# Patient Record
Sex: Female | Born: 1960
Health system: Southern US, Community
[De-identification: ages and names within clinical notes are randomized; demographics above are authoritative.]

## PROBLEM LIST (undated history)

## (undated) DIAGNOSIS — A6000 Herpesviral infection of urogenital system, unspecified: Secondary | ICD-10-CM

## (undated) DIAGNOSIS — M51369 Other intervertebral disc degeneration, lumbar region without mention of lumbar back pain or lower extremity pain: Secondary | ICD-10-CM

## (undated) DIAGNOSIS — M5136 Other intervertebral disc degeneration, lumbar region: Secondary | ICD-10-CM

## (undated) DIAGNOSIS — K219 Gastro-esophageal reflux disease without esophagitis: Secondary | ICD-10-CM

## (undated) DIAGNOSIS — G4700 Insomnia, unspecified: Secondary | ICD-10-CM

## (undated) DIAGNOSIS — G8929 Other chronic pain: Secondary | ICD-10-CM

## (undated) DIAGNOSIS — I10 Essential (primary) hypertension: Secondary | ICD-10-CM

## (undated) DIAGNOSIS — E119 Type 2 diabetes mellitus without complications: Secondary | ICD-10-CM

## (undated) DIAGNOSIS — M549 Dorsalgia, unspecified: Secondary | ICD-10-CM

## (undated) HISTORY — PX: COLONOSCOPY: SHX174

## (undated) HISTORY — DX: Dorsalgia, unspecified: M54.9

## (undated) HISTORY — DX: Other chronic pain: G89.29

---

## 2001-11-11 DIAGNOSIS — I10 Essential (primary) hypertension: Secondary | ICD-10-CM | POA: Insufficient documentation

## 2004-08-26 ENCOUNTER — Ambulatory Visit: Payer: Self-pay | Admitting: Family Medicine

## 2004-10-15 ENCOUNTER — Ambulatory Visit: Payer: Self-pay | Admitting: Gastroenterology

## 2005-03-04 ENCOUNTER — Ambulatory Visit: Payer: Self-pay | Admitting: General Practice

## 2006-04-06 ENCOUNTER — Ambulatory Visit: Payer: Self-pay | Admitting: General Practice

## 2006-09-06 DIAGNOSIS — Z833 Family history of diabetes mellitus: Secondary | ICD-10-CM | POA: Insufficient documentation

## 2006-09-06 DIAGNOSIS — Z800 Family history of malignant neoplasm of digestive organs: Secondary | ICD-10-CM | POA: Insufficient documentation

## 2006-09-24 DIAGNOSIS — A6000 Herpesviral infection of urogenital system, unspecified: Secondary | ICD-10-CM | POA: Insufficient documentation

## 2006-09-24 DIAGNOSIS — K219 Gastro-esophageal reflux disease without esophagitis: Secondary | ICD-10-CM | POA: Insufficient documentation

## 2006-09-24 DIAGNOSIS — D219 Benign neoplasm of connective and other soft tissue, unspecified: Secondary | ICD-10-CM | POA: Insufficient documentation

## 2007-01-20 ENCOUNTER — Ambulatory Visit: Payer: Self-pay | Admitting: Gastroenterology

## 2007-04-14 ENCOUNTER — Ambulatory Visit: Payer: Self-pay | Admitting: Endocrinology

## 2007-12-27 DIAGNOSIS — M545 Low back pain, unspecified: Secondary | ICD-10-CM | POA: Insufficient documentation

## 2009-12-18 DIAGNOSIS — R7309 Other abnormal glucose: Secondary | ICD-10-CM | POA: Insufficient documentation

## 2011-10-14 ENCOUNTER — Ambulatory Visit: Payer: Self-pay

## 2013-02-15 ENCOUNTER — Ambulatory Visit: Payer: Self-pay

## 2013-06-21 ENCOUNTER — Ambulatory Visit: Payer: Self-pay | Admitting: Family Medicine

## 2014-02-20 ENCOUNTER — Ambulatory Visit: Payer: Self-pay | Admitting: Family Medicine

## 2015-01-23 ENCOUNTER — Ambulatory Visit
Admit: 2015-01-23 | Disposition: A | Discharge status: Home / Self Care | Payer: Self-pay | Attending: Family Medicine | Admitting: Family Medicine

## 2015-03-25 ENCOUNTER — Other Ambulatory Visit: Payer: Self-pay | Admitting: Family Medicine

## 2015-03-25 DIAGNOSIS — Z1231 Encounter for screening mammogram for malignant neoplasm of breast: Secondary | ICD-10-CM

## 2015-03-27 ENCOUNTER — Ambulatory Visit
Admission: RE | Admit: 2015-03-27 | Discharge: 2015-03-27 | Disposition: A | Discharge status: Home / Self Care | Payer: BLUE CROSS/BLUE SHIELD | Source: Ambulatory Visit | Attending: Family Medicine | Admitting: Family Medicine

## 2015-03-27 DIAGNOSIS — Z1231 Encounter for screening mammogram for malignant neoplasm of breast: Secondary | ICD-10-CM

## 2015-06-12 ENCOUNTER — Other Ambulatory Visit: Payer: Self-pay | Admitting: Family Medicine

## 2015-06-12 DIAGNOSIS — M545 Low back pain: Secondary | ICD-10-CM

## 2015-06-13 DIAGNOSIS — M79604 Pain in right leg: Secondary | ICD-10-CM | POA: Insufficient documentation

## 2015-06-13 NOTE — Telephone Encounter (Signed)
Last OV 01/2015  Thanks,   -Laura  

## 2015-09-25 ENCOUNTER — Other Ambulatory Visit: Payer: Self-pay | Admitting: Family Medicine

## 2015-09-26 NOTE — Telephone Encounter (Signed)
Refilled Mobic and remind patient to schedule follow up appointment.

## 2015-09-26 NOTE — Telephone Encounter (Signed)
Attempted to contact patient. No answer nor voicemail.  

## 2015-09-27 NOTE — Telephone Encounter (Signed)
Attempted to contact patient. No answer nor voicemail.  

## 2015-10-08 ENCOUNTER — Telehealth: Payer: Self-pay

## 2015-10-08 NOTE — Telephone Encounter (Signed)
pt advised that her mobic was filled but she is due for appt. She states she will call back in the new year.

## 2016-01-28 ENCOUNTER — Ambulatory Visit (INDEPENDENT_AMBULATORY_CARE_PROVIDER_SITE_OTHER): Payer: BLUE CROSS/BLUE SHIELD | Admitting: Family Medicine

## 2016-01-28 ENCOUNTER — Encounter: Payer: Self-pay | Admitting: Family Medicine

## 2016-01-28 VITALS — BP 120/82 | HR 79 | Temp 98.3°F | Resp 16 | Wt 215.2 lb

## 2016-01-28 DIAGNOSIS — M545 Low back pain, unspecified: Secondary | ICD-10-CM | POA: Insufficient documentation

## 2016-01-28 DIAGNOSIS — M5136 Other intervertebral disc degeneration, lumbar region: Secondary | ICD-10-CM | POA: Diagnosis not present

## 2016-01-28 MED ORDER — PREDNISONE 10 MG PO TABS: ORAL_TABLET | ORAL | Status: DC

## 2016-01-28 NOTE — Patient Instructions (Signed)
Consider physical therapy and/or TENS unit. Wear your corset if possible to give your back more support.

## 2016-01-28 NOTE — Progress Notes (Signed)
Subjective:     Patient ID: Janet Walters, female   DOB: [birth year], 55 y.o.   MRN: [id number]  HPI  Chief Complaint  Patient presents with  . Leg Pain    Patient comes in office today with concerns of bilateral leg pain for the past 2-3 weeks. Patient describes pain as achy and she only experiences pain when laying down at night  . Back Pain    Patient would like to address chronic lower back pain, she states that she has been taking otc Aleve for pain and was wearing back brace for support but pain has not decreased.   Has had prior evaluation per orthopedics in 2015 with lumbar x-ray demonstrating significant DDD. Provided with back corset and recommended physical therapy for 4 weeks. Patient states she did not follow through with the therapy. Has seen a chiropractor on one occasion recently. Has taken Aleve intermittently. States she remains active in her job at [employer] and at home doing yard work.   Review of Systems     Objective:   Physical Exam  Constitutional: She appears well-developed and well-nourished. No distress.  Musculoskeletal:  Muscle strength in lower extremities 5/5. SLR's to 90 degrees without radiation of back pain. Mild back discomfort going from sitting to lying.       Assessment:    1. DDD (degenerative disc disease), lumbar - predniSONE (DELTASONE) 10 MG tablet; Taper daily as follows: 6 pills, 5, 4, 3, 2, 1  Dispense: 21 tablet; Refill: 0  2. Midline low back pain, with sciatica presence unspecified - predniSONE (DELTASONE) 10 MG tablet; Taper daily as follows: 6 pills, 5, 4, 3, 2, 1  Dispense: 21 tablet; Refill: 0    Plan:    Have asked her to consider physical therapy, TENS unit, and to wear her corset on a regular basis.

## 2016-02-03 ENCOUNTER — Telehealth: Payer: Self-pay | Admitting: Family Medicine

## 2016-02-03 NOTE — Telephone Encounter (Signed)
Pt was in last Tuesday with leg and back pain,.  She was give predisone.  She finished the medication but is still having rt leg pain.  Please advise.  [phone] or [phone]  Thanks, Con Memos

## 2016-02-04 ENCOUNTER — Other Ambulatory Visit: Payer: Self-pay | Admitting: Family Medicine

## 2016-02-04 NOTE — Telephone Encounter (Signed)
I would like to send her to physical therapy if she agrees. May take two Aleve twice daily with food and wear her corset in the meantime.

## 2016-02-04 NOTE — Telephone Encounter (Signed)
Spoke with patient on the phone and advised as below, she wants to hold off on PT and will try Aleve BID

## 2016-02-04 NOTE — Telephone Encounter (Signed)
Please review. KW 

## 2016-02-04 NOTE — Telephone Encounter (Signed)
[id number] 

## 2016-02-11 ENCOUNTER — Other Ambulatory Visit: Payer: Self-pay | Admitting: Family Medicine

## 2016-02-19 ENCOUNTER — Telehealth: Payer: Self-pay | Admitting: Family Medicine

## 2016-02-21 ENCOUNTER — Ambulatory Visit (INDEPENDENT_AMBULATORY_CARE_PROVIDER_SITE_OTHER): Payer: BLUE CROSS/BLUE SHIELD | Admitting: Family Medicine

## 2016-02-21 ENCOUNTER — Encounter: Payer: Self-pay | Admitting: Family Medicine

## 2016-02-21 VITALS — BP 102/66 | HR 64 | Temp 98.2°F | Resp 16 | Ht 66.0 in | Wt 216.0 lb

## 2016-02-21 DIAGNOSIS — I10 Essential (primary) hypertension: Secondary | ICD-10-CM

## 2016-02-21 DIAGNOSIS — R7309 Other abnormal glucose: Secondary | ICD-10-CM

## 2016-02-21 DIAGNOSIS — Z1231 Encounter for screening mammogram for malignant neoplasm of breast: Secondary | ICD-10-CM

## 2016-02-21 DIAGNOSIS — G4700 Insomnia, unspecified: Secondary | ICD-10-CM

## 2016-02-21 MED ORDER — TRAZODONE HCL 50 MG PO TABS: 25.0000 mg | ORAL_TABLET | Freq: Every evening | ORAL | Status: DC | PRN

## 2016-02-21 NOTE — Progress Notes (Signed)
Subjective:    Patient ID: Janet Walters, female    DOB: [birth year], 55 y.o.   MRN: [id number]  Rash This is a new problem. The current episode started 1 to 4 weeks ago (x 3 days). The problem has been gradually improving since onset. Location: left side of chest. The rash is characterized by itchiness. She was exposed to nothing. Associated symptoms include congestion (sinuses). Pertinent negatives include no anorexia, cough, eye pain, facial edema, fatigue, fever, rhinorrhea, shortness of breath, sore throat or vomiting. Past treatments include nothing.   Also has headaches related to stress.  Learning a new job at work.  Happens for 5 days straight at times but not weekly.   Hot towels helps some.   Happens in spells. Not daily.  Does happen more when stressed.  Does not want a daily medication. Does not want a medication that will make her sleepy or addictive.     Also was having trouble sleeping.   Has no medication for this. Would like a medication to take as needed.  Does not feels she needs something every night.    Review of Systems  Constitutional: Negative for fever and fatigue.  HENT: Positive for congestion (sinuses). Negative for rhinorrhea and sore throat.   Eyes: Negative for pain.  Respiratory: Negative for cough and shortness of breath.   Gastrointestinal: Negative for vomiting and anorexia.  Skin: Positive for rash.  Neurological: Positive for headaches.  Psychiatric/Behavioral: Positive for sleep disturbance.   BP 102/66 mmHg  Pulse 64  Temp(Src) 98.2 F (36.8 C) (Oral)  Resp 16  Ht 5\' 6"  (1.676 m)  Wt 216 lb (97.977 kg)  BMI 34.88 kg/m2     Patient Active Problem List   Diagnosis Date Noted  . DDD (degenerative disc disease), lumbar 01/28/2016  . Leg pain, right 06/13/2015  . Abnormal blood sugar 12/18/2009  . LBP (low back pain) 12/27/2007  . Fibroid 09/24/2006  . Genital herpes 09/24/2006  . Acid reflux 09/24/2006  . Family history of cancer of digestive  system 09/06/2006  . Family history of diabetes mellitus 09/06/2006  . Essential (primary) hypertension 11/11/2001   Past Medical History  Diagnosis Date  . Back pain, chronic    Current Outpatient Prescriptions on File Prior to Visit  Medication Sig  . lisinopril-hydrochlorothiazide (PRINZIDE,ZESTORETIC) 20-25 MG tablet TAKE 1 TABLET BY MOUTH DAILY   No current facility-administered medications on file prior to visit.   No Known Allergies No past surgical history on file. Social History   Social History  . Marital Status: Single    Spouse Name: N/A  . Number of Children: N/A  . Years of Education: N/A   Occupational History  . Not on file.   Social History Main Topics  . Smoking status: Never Smoker   . Smokeless tobacco: Not on file  . Alcohol Use: No  . Drug Use: Not on file  . Sexual Activity: Not on file   Other Topics Concern  . Not on file   Social History Narrative   Family History  Problem Relation Age of Onset  . Breast cancer Paternal Aunt   . Breast cancer Paternal Aunt   . Breast cancer Paternal Aunt     Objective:   Physical Exam  Constitutional: She appears well-developed and well-nourished.  HENT:  Head: Normocephalic and atraumatic.  Right Ear: Tympanic membrane normal.  Left Ear: Tympanic membrane normal.  Mouth/Throat: Oropharynx is clear and moist.  Turbinates are swollen  Cardiovascular:  Normal rate and regular rhythm.   Pulmonary/Chest: Effort normal and breath sounds normal. No respiratory distress.  Psychiatric: She has a normal mood and affect. Her behavior is normal.  BP 102/66 mmHg  Pulse 64  Temp(Src) 98.2 F (36.8 C) (Oral)  Resp 16  Ht 5\' 6"  (1.676 m)  Wt 216 lb (97.977 kg)  BMI 34.88 kg/m2     Assessment & Plan:   1. Encounter for screening mammogram for breast cancer Order place today.   - MM DIGITAL SCREENING BILATERAL; Future  2. Essential (primary) hypertension Condition is stable. Please continue current  medication and  plan of care as noted.  Will check labs.  Follow up with Tawanna Sat for CPE and Simona Huh for her other medical problems.   - Lipid panel - Comprehensive metabolic panel - CBC with Differential/Platelet - TSH  3. Abnormal blood sugar Will check labs.  - Hemoglobin A1c  4. Insomnia New problem. Worsening.   Will treat with medication. Patient instructed to call back if condition worsens or does not improve.    - traZODone (DESYREL) 50 MG tablet; Take 0.5-1 tablets (25-50 mg total) by mouth at bedtime as needed for sleep.  Dispense: 30 tablet; Refill: 3   Patient seen and examined by Jerrell Belfast, MD, and note scribed by Renaldo Fiddler, CMA.  I have reviewed the document for accuracy and completeness and I agree with above. Jerrell Belfast, MD   Margarita Rana, MD

## 2016-03-23 ENCOUNTER — Other Ambulatory Visit: Payer: Self-pay | Admitting: Family Medicine

## 2016-03-23 NOTE — Telephone Encounter (Signed)
See refill request.

## 2016-05-01 DIAGNOSIS — I10 Essential (primary) hypertension: Secondary | ICD-10-CM | POA: Diagnosis not present

## 2016-05-01 DIAGNOSIS — R7309 Other abnormal glucose: Secondary | ICD-10-CM | POA: Diagnosis not present

## 2016-05-02 LAB — CBC WITH DIFFERENTIAL/PLATELET
BASOS: 1 %
Basophils Absolute: 0 10*3/uL (ref 0.0–0.2)
EOS (ABSOLUTE): 0.1 10*3/uL (ref 0.0–0.4)
Eos: 3 %
HEMATOCRIT: 38.4 % (ref 34.0–46.6)
Hemoglobin: 13 g/dL (ref 11.1–15.9)
Immature Grans (Abs): 0 10*3/uL (ref 0.0–0.1)
Immature Granulocytes: 0 %
Lymphocytes Absolute: 1.9 10*3/uL (ref 0.7–3.1)
Lymphs: 33 %
MCH: 26.3 pg — AB (ref 26.6–33.0)
MCHC: 33.9 g/dL (ref 31.5–35.7)
MCV: 78 fL — AB (ref 79–97)
MONOS ABS: 0.4 10*3/uL (ref 0.1–0.9)
Monocytes: 7 %
NEUTROS ABS: 3.2 10*3/uL (ref 1.4–7.0)
Neutrophils: 56 %
PLATELETS: 273 10*3/uL (ref 150–379)
RBC: 4.95 x10E6/uL (ref 3.77–5.28)
RDW: 12.9 % (ref 12.3–15.4)
WBC: 5.6 10*3/uL (ref 3.4–10.8)

## 2016-05-02 LAB — COMPREHENSIVE METABOLIC PANEL
A/G RATIO: 1.8 (ref 1.2–2.2)
ALBUMIN: 4.4 g/dL (ref 3.5–5.5)
ALT: 9 IU/L (ref 0–32)
AST: 16 IU/L (ref 0–40)
Alkaline Phosphatase: 89 IU/L (ref 39–117)
BUN / CREAT RATIO: 17 (ref 9–23)
BUN: 13 mg/dL (ref 6–24)
Bilirubin Total: 0.8 mg/dL (ref 0.0–1.2)
CALCIUM: 9.4 mg/dL (ref 8.7–10.2)
CO2: 22 mmol/L (ref 18–29)
Chloride: 102 mmol/L (ref 96–106)
Creatinine, Ser: 0.76 mg/dL (ref 0.57–1.00)
GFR, EST AFRICAN AMERICAN: 101 mL/min/{1.73_m2} (ref >59)
GFR, EST NON AFRICAN AMERICAN: 88 mL/min/{1.73_m2} (ref >59)
GLOBULIN, TOTAL: 2.5 g/dL (ref 1.5–4.5)
Glucose: 79 mg/dL (ref 65–99)
POTASSIUM: 3.6 mmol/L (ref 3.5–5.2)
SODIUM: 141 mmol/L (ref 134–144)
TOTAL PROTEIN: 6.9 g/dL (ref 6.0–8.5)

## 2016-05-02 LAB — LIPID PANEL
CHOLESTEROL TOTAL: 180 mg/dL (ref 100–199)
Chol/HDL Ratio: 3 ratio units (ref 0.0–4.4)
HDL: 60 mg/dL (ref >39)
LDL CALC: 107 mg/dL — AB (ref 0–99)
TRIGLYCERIDES: 63 mg/dL (ref 0–149)
VLDL Cholesterol Cal: 13 mg/dL (ref 5–40)

## 2016-05-02 LAB — HEMOGLOBIN A1C
Est. average glucose Bld gHb Est-mCnc: 111 mg/dL
Hgb A1c MFr Bld: 5.5 % (ref 4.8–5.6)

## 2016-05-02 LAB — TSH: TSH: 1.95 u[IU]/mL (ref 0.450–4.500)

## 2016-05-05 ENCOUNTER — Other Ambulatory Visit: Payer: Self-pay | Admitting: Family Medicine

## 2016-05-05 ENCOUNTER — Ambulatory Visit
Admission: RE | Admit: 2016-05-05 | Discharge: 2016-05-05 | Disposition: A | Discharge status: Home / Self Care | Payer: BLUE CROSS/BLUE SHIELD | Source: Ambulatory Visit | Attending: Family Medicine | Admitting: Family Medicine

## 2016-05-05 DIAGNOSIS — Z1231 Encounter for screening mammogram for malignant neoplasm of breast: Secondary | ICD-10-CM

## 2016-05-29 ENCOUNTER — Encounter: Payer: Self-pay | Admitting: Physician Assistant

## 2016-05-29 ENCOUNTER — Ambulatory Visit (INDEPENDENT_AMBULATORY_CARE_PROVIDER_SITE_OTHER): Payer: BLUE CROSS/BLUE SHIELD | Admitting: Physician Assistant

## 2016-05-29 VITALS — BP 124/90 | HR 62 | Temp 97.9°F | Resp 16 | Ht 68.0 in | Wt 212.8 lb

## 2016-05-29 DIAGNOSIS — Z1211 Encounter for screening for malignant neoplasm of colon: Secondary | ICD-10-CM

## 2016-05-29 DIAGNOSIS — Z1159 Encounter for screening for other viral diseases: Secondary | ICD-10-CM | POA: Diagnosis not present

## 2016-05-29 DIAGNOSIS — Z0000 Encounter for general adult medical examination without abnormal findings: Secondary | ICD-10-CM

## 2016-05-29 DIAGNOSIS — Z800 Family history of malignant neoplasm of digestive organs: Secondary | ICD-10-CM

## 2016-05-29 DIAGNOSIS — Z124 Encounter for screening for malignant neoplasm of cervix: Secondary | ICD-10-CM | POA: Diagnosis not present

## 2016-05-29 NOTE — Progress Notes (Signed)
Patient: Janet Walters, Female    DOB: [birth year], 55 y.o.   MRN: [id number] Visit Date: 05/29/2016  Today's Provider: Mar Daring, PA-C   Chief Complaint  Patient presents with  . Annual Exam   Subjective:    Annual physical exam Janet Walters is a 55 y.o. female who presents today for health maintenance and complete physical. She feels fairly well. She reports exercising daily at work(walking). She reports she is sleeping average 6 hours per night.  Mammogram: 05/05/16 BI-RADS Cat. 1-Negative Colonoscopy:01/20/2007 -----------------------------------------------------------------   Review of Systems  Constitutional: Negative.   HENT: Positive for dental problem.   Eyes: Negative.   Respiratory: Negative.   Cardiovascular: Negative.   Gastrointestinal: Negative.   Endocrine: Negative.   Genitourinary: Negative.   Musculoskeletal: Positive for arthralgias, back pain and myalgias.  Skin: Negative.   Allergic/Immunologic: Negative.   Neurological: Positive for headaches.  Hematological: Negative.   Psychiatric/Behavioral: Negative.   Headache only today from stress, arthralgias chronic and dental problem she had a root canal break and is going to dentist.  Social History      She  reports that she has never smoked. She has never used smokeless tobacco. She reports that she does not drink alcohol.       Social History   Social History  . Marital status: Single    Spouse name: N/A  . Number of children: N/A  . Years of education: N/A   Social History Main Topics  . Smoking status: Never Smoker  . Smokeless tobacco: Never Used  . Alcohol use No  . Drug use: Unknown  . Sexual activity: Not Asked   Other Topics Concern  . None   Social History Narrative  . None    Past Medical History:  Diagnosis Date  . Back pain, chronic      Patient Active Problem List   Diagnosis Date Noted  . Insomnia 02/21/2016  . DDD (degenerative disc disease),  lumbar 01/28/2016  . Leg pain, right 06/13/2015  . Abnormal blood sugar 12/18/2009  . LBP (low back pain) 12/27/2007  . Fibroid 09/24/2006  . Genital herpes 09/24/2006  . Acid reflux 09/24/2006  . Family history of cancer of digestive system 09/06/2006  . Family history of diabetes mellitus 09/06/2006  . Essential (primary) hypertension 11/11/2001    No past surgical history on file.  Family History        Family Status  Relation Status  . Paternal Aunt   . Paternal Aunt   . Paternal Aunt         Her family history includes Breast cancer in her paternal aunt, paternal aunt, and paternal aunt.    No Known Allergies  Current Meds  Medication Sig  . ibuprofen (ADVIL,MOTRIN) 800 MG tablet TAKE 1 TABLET BY MOUTH 3 TIMES A [place] WITH FOOD AS NEEDED FOR BACK PAIN  . lisinopril-hydrochlorothiazide (PRINZIDE,ZESTORETIC) 20-25 MG tablet TAKE 1 TABLET BY MOUTH DAILY  . traZODone (DESYREL) 50 MG tablet Take 0.5-1 tablets (25-50 mg total) by mouth at bedtime as needed for sleep.    Patient Care Team: Margo Common, PA as PCP - General (Family Medicine)     Objective:   Vitals: BP 124/90 (BP Location: Right Arm, Patient Position: Sitting, Cuff Size: Large)   Pulse 62   Temp 97.9 F (36.6 C) (Oral)   Resp 16   Ht 5\' 8"  (1.727 m)   Wt 212 lb 12.8 oz (96.5 kg)  BMI 32.36 kg/m    Physical Exam  Constitutional: She is oriented to person, place, and time. She appears well-developed and well-nourished. No distress.  HENT:  Head: Normocephalic and atraumatic.  Right Ear: Hearing, tympanic membrane, external ear and ear canal normal.  Left Ear: Hearing, tympanic membrane, external ear and ear canal normal.  Nose: Nose normal.  Mouth/Throat: Uvula is midline, oropharynx is clear and moist and mucous membranes are normal. No oropharyngeal exudate.  Eyes: Conjunctivae and EOM are normal. Pupils are equal, round, and reactive to light. Right eye exhibits no discharge. Left eye  exhibits no discharge. No scleral icterus.  Neck: Normal range of motion. Neck supple. No JVD present. Carotid bruit is not present. No tracheal deviation present. No thyromegaly present.  Cardiovascular: Normal rate, regular rhythm, normal heart sounds and intact distal pulses.  Exam reveals no gallop and no friction rub.   No murmur heard. Pulmonary/Chest: Effort normal and breath sounds normal. No respiratory distress. She has no wheezes. She has no rales. She exhibits no tenderness. Right breast exhibits no inverted nipple, no mass, no nipple discharge, no skin change and no tenderness. Left breast exhibits no inverted nipple, no mass, no nipple discharge, no skin change and no tenderness. Breasts are symmetrical.  Abdominal: Soft. Bowel sounds are normal. She exhibits no distension and no mass. There is no tenderness. There is no rebound and no guarding. Hernia confirmed negative in the right inguinal area and confirmed negative in the left inguinal area.  Genitourinary: Rectum normal, vagina normal and uterus normal. No breast swelling, tenderness, discharge or bleeding. Pelvic exam was performed with patient supine. There is no rash, tenderness, lesion or injury on the right labia. There is no rash, tenderness, lesion or injury on the left labia. Cervix exhibits no motion tenderness, no discharge and no friability. Right adnexum displays no mass, no tenderness and no fullness. Left adnexum displays no mass, no tenderness and no fullness. No erythema, tenderness or bleeding in the vagina. No signs of injury around the vagina. No vaginal discharge found.  Musculoskeletal: Normal range of motion. She exhibits no edema or tenderness.  Lymphadenopathy:    She has no cervical adenopathy.       Right: No inguinal adenopathy present.       Left: No inguinal adenopathy present.  Neurological: She is alert and oriented to person, place, and time. She has normal reflexes. No cranial nerve deficit.  Coordination normal.  Skin: Skin is warm and dry. No rash noted. She is not diaphoretic.  Psychiatric: She has a normal mood and affect. Her behavior is normal. Judgment and thought content normal.  Vitals reviewed.    Depression Screen No flowsheet data found.    Assessment & Plan:     Routine Health Maintenance and Physical Exam  Exercise Activities and Dietary recommendations Goals    None       There is no immunization history on file for this patient.  Health Maintenance  Topic Date Due  . Hepatitis C Screening  [birth year]  . HIV Screening  07/28/1974  . TETANUS/TDAP  07/28/1978  . PAP SMEAR  07/28/1980  . COLONOSCOPY  07/28/2009  . INFLUENZA VACCINE  05/12/2016  . MAMMOGRAM  05/05/2018      Discussed health benefits of physical activity, and encouraged her to engage in regular exercise appropriate for her age and condition.    1. Annual physical exam Normal physical exam today. Labs were normal from 05/01/16.  She is to call  the office in the meantime if she has any acute issue, questions or concerns.  2. Family history of cancer of digestive system Referral placed as below. Father had some form of cancer, patient doesn't remember, states he had a colostomy prior to his death in 23.  H/O polyps. - Ambulatory referral to Gastroenterology  4. Cervical cancer screening Pap collected today. Will send as below and f/u pending results. - Pap IG and HPV (high risk) DNA detection ([facility])  5. Colon cancer screening - Ambulatory referral to Gastroenterology  6. Need for hepatitis C screening test - Hepatitis C Antibody  --------------------------------------------------------------------    Mar Daring, PA-C  [facility]

## 2016-05-29 NOTE — Patient Instructions (Signed)
Health Maintenance, Female Adopting a healthy lifestyle and getting preventive care can go a long way to promote health and wellness. Talk with your health care provider about what schedule of regular examinations is right for you. This is a good chance for you to check in with your provider about disease prevention and staying healthy. In between checkups, there are plenty of things you can do on your own. Experts have done a lot of research about which lifestyle changes and preventive measures are most likely to keep you healthy. Ask your health care provider for more information. WEIGHT AND DIET  Eat a healthy diet  Be sure to include plenty of vegetables, fruits, low-fat dairy products, and lean protein.  Do not eat a lot of foods high in solid fats, added sugars, or salt.  Get regular exercise. This is one of the most important things you can do for your health.  Most adults should exercise for at least 150 minutes each week. The exercise should increase your heart rate and make you sweat (moderate-intensity exercise).  Most adults should also do strengthening exercises at least twice a week. This is in addition to the moderate-intensity exercise.  Maintain a healthy weight  Body mass index (BMI) is a measurement that can be used to identify possible weight problems. It estimates body fat based on height and weight. Your health care provider can help determine your BMI and help you achieve or maintain a healthy weight.  For females 28 years of age and older:   A BMI below 18.5 is considered underweight.  A BMI of 18.5 to 24.9 is normal.  A BMI of 25 to 29.9 is considered overweight.  A BMI of 30 and above is considered obese.  Watch levels of cholesterol and blood lipids  You should start having your blood tested for lipids and cholesterol at 57 years of age, then have this test every 5 years.  You may need to have your cholesterol levels checked more often if:  Your lipid  or cholesterol levels are high.  You are older than 57 years of age.  You are at high risk for heart disease.  CANCER SCREENING   Lung Cancer  Lung cancer screening is recommended for adults 57-66 years old who are at high risk for lung cancer because of a history of smoking.  A yearly low-dose CT scan of the lungs is recommended for people who:  Currently smoke.  Have quit within the past 15 years.  Have at least a 30-pack-year history of smoking. A pack year is smoking an average of one pack of cigarettes a [place] for 1 year.  Yearly screening should continue until it has been 15 years since you quit.  Yearly screening should stop if you develop a health problem that would prevent you from having lung cancer treatment.  Breast Cancer  Practice breast self-awareness. This means understanding how your breasts normally appear and feel.  It also means doing regular breast self-exams. Let your health care provider know about any changes, no matter how small.  If you are in your 57s or 30s, you should have a clinical breast exam (CBE) by a health care provider every 1-3 years as part of a regular health exam.  If you are 25 or older, have a CBE every year. Also consider having a breast X-ray (mammogram) every year.  If you have a family history of breast cancer, talk to your health care provider about genetic screening.  If you  are at high risk for breast cancer, talk to your health care provider about having an MRI and a mammogram every year.  Breast cancer gene (BRCA) assessment is recommended for women who have family members with BRCA-related cancers. BRCA-related cancers include:  Breast.  Ovarian.  Tubal.  Peritoneal cancers.  Results of the assessment will determine the need for genetic counseling and BRCA1 and BRCA2 testing. Cervical Cancer Your health care provider may recommend that you be screened regularly for cancer of the pelvic organs (ovaries, uterus, and  vagina). This screening involves a pelvic examination, including checking for microscopic changes to the surface of your cervix (Pap test). You may be encouraged to have this screening done every 3 years, beginning at age 21.  For women ages 30-65, health care providers may recommend pelvic exams and Pap testing every 3 years, or they may recommend the Pap and pelvic exam, combined with testing for human papilloma virus (HPV), every 5 years. Some types of HPV increase your risk of cervical cancer. Testing for HPV may also be done on women of any age with unclear Pap test results.  Other health care providers may not recommend any screening for nonpregnant women who are considered low risk for pelvic cancer and who do not have symptoms. Ask your health care provider if a screening pelvic exam is right for you.  If you have had past treatment for cervical cancer or a condition that could lead to cancer, you need Pap tests and screening for cancer for at least 20 years after your treatment. If Pap tests have been discontinued, your risk factors (such as having a new sexual partner) need to be reassessed to determine if screening should resume. Some women have medical problems that increase the chance of getting cervical cancer. In these cases, your health care provider may recommend more frequent screening and Pap tests. Colorectal Cancer  This type of cancer can be detected and often prevented.  Routine colorectal cancer screening usually begins at 57 years of age and continues through 57 years of age.  Your health care provider may recommend screening at an earlier age if you have risk factors for colon cancer.  Your health care provider may also recommend using home test kits to check for hidden blood in the stool.  A small camera at the end of a tube can be used to examine your colon directly (sigmoidoscopy or colonoscopy). This is done to check for the earliest forms of colorectal  cancer.  Routine screening usually begins at age 50.  Direct examination of the colon should be repeated every 5-10 years through 57 years of age. However, you may need to be screened more often if early forms of precancerous polyps or small growths are found. Skin Cancer  Check your skin from head to toe regularly.  Tell your health care provider about any new moles or changes in moles, especially if there is a change in a mole's shape or color.  Also tell your health care provider if you have a mole that is larger than the size of a pencil eraser.  Always use sunscreen. Apply sunscreen liberally and repeatedly throughout the [place].  Protect yourself by wearing long sleeves, pants, a wide-brimmed hat, and sunglasses whenever you are outside. HEART DISEASE, DIABETES, AND HIGH BLOOD PRESSURE   High blood pressure causes heart disease and increases the risk of stroke. High blood pressure is more likely to develop in:  People who have blood pressure in the high end   of the normal range (130-139/85-89 mm Hg).  People who are overweight or obese.  People who are African American.  If you are 38-23 years of age, have your blood pressure checked every 3-5 years. If you are 61 years of age or older, have your blood pressure checked every year. You should have your blood pressure measured twice--once when you are at a hospital or clinic, and once when you are not at a hospital or clinic. Record the average of the two measurements. To check your blood pressure when you are not at a hospital or clinic, you can use:  An automated blood pressure machine at a pharmacy.  A home blood pressure monitor.  If you are between 45 years and 39 years old, ask your health care provider if you should take aspirin to prevent strokes.  Have regular diabetes screenings. This involves taking a blood sample to check your fasting blood sugar level.  If you are at a normal weight and have a low risk for diabetes,  have this test once every three years after 57 years of age.  If you are overweight and have a high risk for diabetes, consider being tested at a younger age or more often. PREVENTING INFECTION  Hepatitis B  If you have a higher risk for hepatitis B, you should be screened for this virus. You are considered at high risk for hepatitis B if:  You were born in a country where hepatitis B is common. Ask your health care provider which countries are considered high risk.  Your parents were born in a high-risk country, and you have not been immunized against hepatitis B (hepatitis B vaccine).  You have HIV or AIDS.  You use needles to inject street drugs.  You live with someone who has hepatitis B.  You have had sex with someone who has hepatitis B.  You get hemodialysis treatment.  You take certain medicines for conditions, including cancer, organ transplantation, and autoimmune conditions. Hepatitis C  Blood testing is recommended for:  Everyone born from [birth year] through 1967.  Anyone with known risk factors for hepatitis C. Sexually transmitted infections (STIs)  You should be screened for sexually transmitted infections (STIs) including gonorrhea and chlamydia if:  You are sexually active and are younger than 57 years of age.  You are older than 57 years of age and your health care provider tells you that you are at risk for this type of infection.  Your sexual activity has changed since you were last screened and you are at an increased risk for chlamydia or gonorrhea. Ask your health care provider if you are at risk.  If you do not have HIV, but are at risk, it may be recommended that you take a prescription medicine daily to prevent HIV infection. This is called pre-exposure prophylaxis (PrEP). You are considered at risk if:  You are sexually active and do not regularly use condoms or know the HIV status of your partner(s).  You take drugs by injection.  You are sexually  active with a partner who has HIV. Talk with your health care provider about whether you are at high risk of being infected with HIV. If you choose to begin PrEP, you should first be tested for HIV. You should then be tested every 3 months for as long as you are taking PrEP.  PREGNANCY   If you are premenopausal and you may become pregnant, ask your health care provider about preconception counseling.  If you may  become pregnant, take 400 to 800 micrograms (mcg) of folic acid every [place].  If you want to prevent pregnancy, talk to your health care provider about birth control (contraception). OSTEOPOROSIS AND MENOPAUSE   Osteoporosis is a disease in which the bones lose minerals and strength with aging. This can result in serious bone fractures. Your risk for osteoporosis can be identified using a bone density scan.  If you are 61 years of age or older, or if you are at risk for osteoporosis and fractures, ask your health care provider if you should be screened.  Ask your health care provider whether you should take a calcium or vitamin D supplement to lower your risk for osteoporosis.  Menopause may have certain physical symptoms and risks.  Hormone replacement therapy may reduce some of these symptoms and risks. Talk to your health care provider about whether hormone replacement therapy is right for you.  HOME CARE INSTRUCTIONS   Schedule regular health, dental, and eye exams.  Stay current with your immunizations.   Do not use any tobacco products including cigarettes, chewing tobacco, or electronic cigarettes.  If you are pregnant, do not drink alcohol.  If you are breastfeeding, limit how much and how often you drink alcohol.  Limit alcohol intake to no more than 1 drink per [place] for nonpregnant women. One drink equals 12 ounces of beer, 5 ounces of wine, or 1 ounces of hard liquor.  Do not use street drugs.  Do not share needles.  Ask your health care provider for help if  you need support or information about quitting drugs.  Tell your health care provider if you often feel depressed.  Tell your health care provider if you have ever been abused or do not feel safe at home.   This information is not intended to replace advice given to you by your health care provider. Make sure you discuss any questions you have with your health care provider.   Document Released: 04/13/2011 Document Revised: 10/19/2014 Document Reviewed: 08/30/2013 Elsevier Interactive Patient Education [employer].

## 2016-05-30 LAB — HEPATITIS C ANTIBODY

## 2016-06-01 ENCOUNTER — Telehealth: Payer: Self-pay

## 2016-06-01 NOTE — Telephone Encounter (Signed)
-----   Message from Mar Daring, PA-C sent at 06/01/2016 11:16 AM EDT ----- Hep c negative

## 2016-06-01 NOTE — Telephone Encounter (Signed)
Tried calling; no answer.  06/01/2016   Thanks,   -Mickel Baas

## 2016-06-02 NOTE — Telephone Encounter (Signed)
LMTCB  Thanks,  -Joseline 

## 2016-06-02 NOTE — Telephone Encounter (Signed)
Pt advised.   Thanks,   -Nikkol Pai  

## 2016-06-03 ENCOUNTER — Telehealth: Payer: Self-pay

## 2016-06-03 LAB — PAP IG AND HPV HIGH-RISK
HPV, high-risk: NEGATIVE
PAP SMEAR COMMENT: 0

## 2016-06-03 NOTE — Telephone Encounter (Signed)
LMTCB

## 2016-06-03 NOTE — Telephone Encounter (Signed)
-----   Message from Mar Daring, [place] sent at 06/03/2016  1:12 PM EDT ----- Pap is normal, HPV negative. Will repeat in 5 years.

## 2016-06-04 NOTE — Telephone Encounter (Signed)
Patient advised as directed below. 

## 2016-06-04 NOTE — Telephone Encounter (Signed)
NO answer at home number and cell number not in service.  Thanks,  -Joseline

## 2016-06-30 DIAGNOSIS — Z8371 Family history of colonic polyps: Secondary | ICD-10-CM | POA: Diagnosis not present

## 2016-06-30 DIAGNOSIS — Z1211 Encounter for screening for malignant neoplasm of colon: Secondary | ICD-10-CM | POA: Diagnosis not present

## 2016-06-30 DIAGNOSIS — Z800 Family history of malignant neoplasm of digestive organs: Secondary | ICD-10-CM | POA: Diagnosis not present

## 2016-07-22 ENCOUNTER — Ambulatory Visit (INDEPENDENT_AMBULATORY_CARE_PROVIDER_SITE_OTHER): Payer: BLUE CROSS/BLUE SHIELD | Admitting: Physician Assistant

## 2016-07-22 ENCOUNTER — Encounter: Payer: Self-pay | Admitting: Physician Assistant

## 2016-07-22 VITALS — BP 124/86 | HR 64 | Temp 98.5°F | Resp 16 | Wt 209.0 lb

## 2016-07-22 DIAGNOSIS — G43009 Migraine without aura, not intractable, without status migrainosus: Secondary | ICD-10-CM

## 2016-07-22 MED ORDER — PROMETHAZINE HCL 25 MG PO TABS: 25.0000 mg | ORAL_TABLET | Freq: Three times a day (TID) | ORAL | 0 refills | Status: DC | PRN

## 2016-07-22 NOTE — Patient Instructions (Signed)
Do not Take excedrin with ibuprofen, precautions with phenergan = drowsiness. Please don't drive with this medication  Migraine Headache A migraine headache is an intense, throbbing pain on one or both sides of your head. A migraine can last for 30 minutes to several hours. CAUSES  The exact cause of a migraine headache is not always known. However, a migraine may be caused when nerves in the brain become irritated and release chemicals that cause inflammation. This causes pain. Certain things may also trigger migraines, such as:  Alcohol.  Smoking.  Stress.  Menstruation.  Aged cheeses.  Foods or drinks that contain nitrates, glutamate, aspartame, or tyramine.  Lack of sleep.  Chocolate.  Caffeine.  Hunger.  Physical exertion.  Fatigue.  Medicines used to treat chest pain (nitroglycerine), birth control pills, estrogen, and some blood pressure medicines. SIGNS AND SYMPTOMS  Pain on one or both sides of your head.  Pulsating or throbbing pain.  Severe pain that prevents daily activities.  Pain that is aggravated by any physical activity.  Nausea, vomiting, or both.  Dizziness.  Pain with exposure to bright lights, loud noises, or activity.  General sensitivity to bright lights, loud noises, or smells. Before you get a migraine, you may get warning signs that a migraine is coming (aura). An aura may include:  Seeing flashing lights.  Seeing bright spots, halos, or zigzag lines.  Having tunnel vision or blurred vision.  Having feelings of numbness or tingling.  Having trouble talking.  Having muscle weakness. DIAGNOSIS  A migraine headache is often diagnosed based on:  Symptoms.  Physical exam.  A CT scan or MRI of your head. These imaging tests cannot diagnose migraines, but they can help rule out other causes of headaches. TREATMENT Medicines may be given for pain and nausea. Medicines can also be given to help prevent recurrent migraines.    HOME CARE INSTRUCTIONS  Only take over-the-counter or prescription medicines for pain or discomfort as directed by your health care provider. The use of long-term narcotics is not recommended.  Lie down in a dark, quiet room when you have a migraine.  Keep a journal to find out what may trigger your migraine headaches. For example, write down:  What you eat and drink.  How much sleep you get.  Any change to your diet or medicines.  Limit alcohol consumption.  Quit smoking if you smoke.  Get 7-9 hours of sleep, or as recommended by your health care provider.  Limit stress.  Keep lights dim if bright lights bother you and make your migraines worse. SEEK IMMEDIATE MEDICAL CARE IF:   Your migraine becomes severe.  You have a fever.  You have a stiff neck.  You have vision loss.  You have muscular weakness or loss of muscle control.  You start losing your balance or have trouble walking.  You feel faint or pass out.  You have severe symptoms that are different from your first symptoms. MAKE SURE YOU:   Understand these instructions.  Will watch your condition.  Will get help right away if you are not doing well or get worse.   This information is not intended to replace advice given to you by your health care provider. Make sure you discuss any questions you have with your health care provider.   Document Released: 09/28/2005 Document Revised: 10/19/2014 Document Reviewed: 06/05/2013 Elsevier Interactive Patient Education [employer].

## 2016-07-22 NOTE — Progress Notes (Signed)
Patient: Janet Walters Female    DOB: August 24, 1961   55 y.o.   MRN: [id number] Visit Date: 07/22/2016  Today's Provider: Trinna Post, PA-C   Chief Complaint  Patient presents with  . Sinusitis  . Headache   Subjective:    Sinusitis  This is a new problem. The current episode started yesterday. The problem has been gradually worsening since onset. There has been no fever. The pain is severe. Associated symptoms include chills, ear pain (Right ear pain), headaches and sinus pressure. Pertinent negatives include no congestion, coughing, diaphoresis, neck pain, shortness of breath, sneezing, sore throat or swollen glands.  Headache   This is a new problem. The current episode started yesterday. The problem has been gradually worsening. The pain is located in the right unilateral and retro-orbital region. The pain does not radiate. The pain quality is similar to prior headaches. The quality of the pain is described as stabbing, sharp and aching. The pain is at a severity of 9/10. Associated symptoms include ear pain (Right ear pain), nausea, sinus pressure and vomiting. Pertinent negatives include no abdominal pain, coughing, dizziness, eye pain, eye redness, fever, neck pain, numbness, photophobia, rhinorrhea, seizures, sore throat, swollen glands, tinnitus or weakness. The treatment provided no relief.   Patient reports headache since 11:00 AM yesterday and worsening since then. Patient reports that this is similar to her usual headaches. Patient reports right sided 9/10 pain behind her eye that is pulsatile in nature. Hhas not taken any medication. Hot towels on head did not work. No photophobia or phonophobia. Patient reports blurry vision in the past [place] in right eye. No discharge in eye or nose during headache. Positive for ear pain. Vomited yesterday four times clear fluids. Patient reports stress at work and headaches usually occur with stress. Patient reports recent increase in  workload. No trauma. Patient thinks she has sinusitis because air conditioner at work was blowing on her.    No Known Allergies   Current Outpatient Prescriptions:  .  ibuprofen (ADVIL,MOTRIN) 800 MG tablet, TAKE 1 TABLET BY MOUTH 3 TIMES A [place] WITH FOOD AS NEEDED FOR BACK PAIN, Disp: 60 tablet, Rfl: 1 .  lisinopril-hydrochlorothiazide (PRINZIDE,ZESTORETIC) 20-25 MG tablet, TAKE 1 TABLET BY MOUTH DAILY, Disp: 90 tablet, Rfl: 3 .  traZODone (DESYREL) 50 MG tablet, Take 0.5-1 tablets (25-50 mg total) by mouth at bedtime as needed for sleep., Disp: 30 tablet, Rfl: 3 .  promethazine (PHENERGAN) 25 MG tablet, Take 1 tablet (25 mg total) by mouth every 8 (eight) hours as needed for nausea or vomiting., Disp: 15 tablet, Rfl: 0  Review of Systems  Constitutional: Positive for chills and fatigue. Negative for activity change, appetite change, diaphoresis, fever and unexpected weight change.  HENT: Positive for ear pain (Right ear pain) and sinus pressure. Negative for congestion, ear discharge, nosebleeds, postnasal drip, rhinorrhea, sneezing, sore throat, tinnitus, trouble swallowing and voice change.   Eyes: Positive for visual disturbance (Right eye is blurry). Negative for photophobia, pain, discharge, redness and itching.  Respiratory: Negative.  Negative for cough and shortness of breath.   Cardiovascular: Negative.   Gastrointestinal: Positive for nausea and vomiting. Negative for abdominal distention, abdominal pain, anal bleeding, blood in stool, constipation, diarrhea and rectal pain.  Musculoskeletal: Negative for neck pain.  Allergic/Immunologic: Positive for environmental allergies (Especially when mowing her yard.).  Neurological: Positive for headaches. Negative for dizziness, tremors, seizures, syncope, facial asymmetry, speech difficulty, weakness, light-headedness and numbness.  Hematological: Does not bruise/bleed easily.    Social History  Substance Use Topics  . Smoking status:  Never Smoker  . Smokeless tobacco: Never Used  . Alcohol use No   Objective:   BP 124/86 (BP Location: Left Arm, Patient Position: Sitting, Cuff Size: Normal)   Pulse 64   Temp 98.5 F (36.9 C) (Oral)   Resp 16   Wt 209 lb (94.8 kg)   BMI 31.78 kg/m   Physical Exam  Constitutional: She is oriented to person, place, and time. Vital signs are normal. She appears well-developed and well-nourished.  Non-toxic appearance. She does not have a sickly appearance. She does not appear ill.  Patient holding right side of head, appears to be in pain.  HENT:  Head: Normocephalic and atraumatic.  Right Ear: Tympanic membrane and external ear normal.  Left Ear: Tympanic membrane and external ear normal.  Nose: Nose normal.  Mouth/Throat: Oropharynx is clear and moist. No oropharyngeal exudate, posterior oropharyngeal edema or posterior oropharyngeal erythema.  Eyes: Conjunctivae and EOM are normal. Pupils are equal, round, and reactive to light. Right eye exhibits no discharge. Left eye exhibits no discharge.  Neck: Normal range of motion. Neck supple.  Cardiovascular: Normal rate, regular rhythm and normal heart sounds.   Pulmonary/Chest: Breath sounds normal. No respiratory distress. She has no wheezes. She has no rales.  Musculoskeletal: She exhibits no edema.  Lymphadenopathy:    She has no cervical adenopathy.  Neurological: She is alert and oriented to person, place, and time. She has normal strength. No cranial nerve deficit or sensory deficit. GCS eye subscore is 4. GCS verbal subscore is 5. GCS motor subscore is 6.  Skin: Skin is warm and dry. She is not diaphoretic.        Assessment & Plan:      Problem List Items Addressed This Visit    None    Visit Diagnoses    Migraine without aura and without status migrainosus, not intractable    -  Primary   Relevant Medications   promethazine (PHENERGAN) 25 MG tablet     Patient is 55 y/o female with history of similar headaches  presenting with migraine. Patient has not tried anything for headache. Patient may try excedrin. Rx for antiemetic. Patient advised not to drive with antiemetic as it is sedating. Avoid using with trazodone. Advised patient not to take ibuprofen concurrently and not to overuse ibuprofen to prevent rebound headaches.   Return if symptoms worsen or fail to improve.    Patient Instructions  Do not Take excedrin with ibuprofen, precautions with phenergan = drowsiness. Please don't drive with this medication  Migraine Headache A migraine headache is an intense, throbbing pain on one or both sides of your head. A migraine can last for 30 minutes to several hours. CAUSES  The exact cause of a migraine headache is not always known. However, a migraine may be caused when nerves in the brain become irritated and release chemicals that cause inflammation. This causes pain. Certain things may also trigger migraines, such as:  Alcohol.  Smoking.  Stress.  Menstruation.  Aged cheeses.  Foods or drinks that contain nitrates, glutamate, aspartame, or tyramine.  Lack of sleep.  Chocolate.  Caffeine.  Hunger.  Physical exertion.  Fatigue.  Medicines used to treat chest pain (nitroglycerine), birth control pills, estrogen, and some blood pressure medicines. SIGNS AND SYMPTOMS  Pain on one or both sides of your head.  Pulsating or throbbing pain.  Severe pain  that prevents daily activities.  Pain that is aggravated by any physical activity.  Nausea, vomiting, or both.  Dizziness.  Pain with exposure to bright lights, loud noises, or activity.  General sensitivity to bright lights, loud noises, or smells. Before you get a migraine, you may get warning signs that a migraine is coming (aura). An aura may include:  Seeing flashing lights.  Seeing bright spots, halos, or zigzag lines.  Having tunnel vision or blurred vision.  Having feelings of numbness or tingling.  Having  trouble talking.  Having muscle weakness. DIAGNOSIS  A migraine headache is often diagnosed based on:  Symptoms.  Physical exam.  A CT scan or MRI of your head. These imaging tests cannot diagnose migraines, but they can help rule out other causes of headaches. TREATMENT Medicines may be given for pain and nausea. Medicines can also be given to help prevent recurrent migraines.  HOME CARE INSTRUCTIONS  Only take over-the-counter or prescription medicines for pain or discomfort as directed by your health care provider. The use of long-term narcotics is not recommended.  Lie down in a dark, quiet room when you have a migraine.  Keep a journal to find out what may trigger your migraine headaches. For example, write down:  What you eat and drink.  How much sleep you get.  Any change to your diet or medicines.  Limit alcohol consumption.  Quit smoking if you smoke.  Get 7-9 hours of sleep, or as recommended by your health care provider.  Limit stress.  Keep lights dim if bright lights bother you and make your migraines worse. SEEK IMMEDIATE MEDICAL CARE IF:   Your migraine becomes severe.  You have a fever.  You have a stiff neck.  You have vision loss.  You have muscular weakness or loss of muscle control.  You start losing your balance or have trouble walking.  You feel faint or pass out.  You have severe symptoms that are different from your first symptoms. MAKE SURE YOU:   Understand these instructions.  Will watch your condition.  Will get help right away if you are not doing well or get worse.   This information is not intended to replace advice given to you by your health care provider. Make sure you discuss any questions you have with your health care provider.   Document Released: 09/28/2005 Document Revised: 10/19/2014 Document Reviewed: 06/05/2013 Elsevier Interactive Patient Education [employer].      The entirety of the information  documented in the History of Present Illness, Review of Systems and Physical Exam were personally obtained by me. Portions of this information were initially documented by Ashley Royalty, CMA and reviewed by me for thoroughness and accuracy.         Trinna Post, PA-C  [facility]

## 2016-07-30 ENCOUNTER — Other Ambulatory Visit: Payer: Self-pay | Admitting: Family Medicine

## 2016-07-30 DIAGNOSIS — G4700 Insomnia, unspecified: Secondary | ICD-10-CM

## 2016-09-17 ENCOUNTER — Encounter: Payer: Self-pay | Admitting: *Deleted

## 2016-09-18 ENCOUNTER — Ambulatory Visit: Payer: BLUE CROSS/BLUE SHIELD | Admitting: Anesthesiology

## 2016-09-18 ENCOUNTER — Encounter
Admission: RE | Disposition: A | Discharge status: Home / Self Care | Payer: Self-pay | Source: Ambulatory Visit | Attending: Unknown Physician Specialty

## 2016-09-18 ENCOUNTER — Ambulatory Visit
Admission: RE | Admit: 2016-09-18 | Discharge: 2016-09-18 | Disposition: A | Discharge status: Home / Self Care | Payer: BLUE CROSS/BLUE SHIELD | Source: Ambulatory Visit | Attending: Unknown Physician Specialty | Admitting: Unknown Physician Specialty

## 2016-09-18 ENCOUNTER — Encounter: Payer: Self-pay | Admitting: *Deleted

## 2016-09-18 DIAGNOSIS — G8929 Other chronic pain: Secondary | ICD-10-CM | POA: Insufficient documentation

## 2016-09-18 DIAGNOSIS — K219 Gastro-esophageal reflux disease without esophagitis: Secondary | ICD-10-CM | POA: Diagnosis not present

## 2016-09-18 DIAGNOSIS — Z79899 Other long term (current) drug therapy: Secondary | ICD-10-CM | POA: Insufficient documentation

## 2016-09-18 DIAGNOSIS — K640 First degree hemorrhoids: Secondary | ICD-10-CM | POA: Insufficient documentation

## 2016-09-18 DIAGNOSIS — D125 Benign neoplasm of sigmoid colon: Secondary | ICD-10-CM | POA: Diagnosis not present

## 2016-09-18 DIAGNOSIS — M5136 Other intervertebral disc degeneration, lumbar region: Secondary | ICD-10-CM | POA: Insufficient documentation

## 2016-09-18 DIAGNOSIS — K5730 Diverticulosis of large intestine without perforation or abscess without bleeding: Secondary | ICD-10-CM | POA: Insufficient documentation

## 2016-09-18 DIAGNOSIS — K635 Polyp of colon: Secondary | ICD-10-CM | POA: Diagnosis not present

## 2016-09-18 DIAGNOSIS — G4700 Insomnia, unspecified: Secondary | ICD-10-CM | POA: Insufficient documentation

## 2016-09-18 DIAGNOSIS — Z1211 Encounter for screening for malignant neoplasm of colon: Secondary | ICD-10-CM | POA: Insufficient documentation

## 2016-09-18 DIAGNOSIS — I10 Essential (primary) hypertension: Secondary | ICD-10-CM | POA: Diagnosis not present

## 2016-09-18 DIAGNOSIS — Z800 Family history of malignant neoplasm of digestive organs: Secondary | ICD-10-CM | POA: Insufficient documentation

## 2016-09-18 DIAGNOSIS — K648 Other hemorrhoids: Secondary | ICD-10-CM | POA: Diagnosis not present

## 2016-09-18 HISTORY — DX: Herpesviral infection of urogenital system, unspecified: A60.00

## 2016-09-18 HISTORY — DX: Other intervertebral disc degeneration, lumbar region: M51.36

## 2016-09-18 HISTORY — PX: COLONOSCOPY WITH PROPOFOL: SHX5780

## 2016-09-18 HISTORY — DX: Gastro-esophageal reflux disease without esophagitis: K21.9

## 2016-09-18 HISTORY — DX: Insomnia, unspecified: G47.00

## 2016-09-18 HISTORY — DX: Other intervertebral disc degeneration, lumbar region without mention of lumbar back pain or lower extremity pain: M51.369

## 2016-09-18 HISTORY — DX: Essential (primary) hypertension: I10

## 2016-09-18 SURGERY — COLONOSCOPY WITH PROPOFOL
Anesthesia: General

## 2016-09-18 MED ORDER — SODIUM CHLORIDE 0.9 % IV SOLN
INTRAVENOUS | Status: DC
  Administered 2016-09-18: 16:00:00 via INTRAVENOUS

## 2016-09-18 MED ORDER — PROPOFOL 10 MG/ML IV BOLUS
INTRAVENOUS | Status: DC | PRN
  Administered 2016-09-18: 70 mg via INTRAVENOUS

## 2016-09-18 MED ORDER — PROPOFOL 500 MG/50ML IV EMUL
INTRAVENOUS | Status: DC | PRN
  Administered 2016-09-18: 120 ug/kg/min via INTRAVENOUS

## 2016-09-18 MED ORDER — EPHEDRINE SULFATE 50 MG/ML IJ SOLN
INTRAMUSCULAR | Status: DC | PRN
  Administered 2016-09-18: 10 mg via INTRAVENOUS

## 2016-09-18 MED ORDER — SODIUM CHLORIDE 0.9 % IV SOLN: INTRAVENOUS | Status: DC

## 2016-09-18 MED ORDER — LIDOCAINE HCL (CARDIAC) 20 MG/ML IV SOLN
INTRAVENOUS | Status: DC | PRN
  Administered 2016-09-18: 60 mg via INTRAVENOUS

## 2016-09-18 NOTE — Anesthesia Preprocedure Evaluation (Signed)
Anesthesia Evaluation  Patient identified by MRN, date of birth, ID band Patient awake    Reviewed: Allergy & Precautions, H&P , NPO status , Patient's Chart, lab work & pertinent test results, reviewed documented beta blocker date and time   History of Anesthesia Complications Negative for: history of anesthetic complications  Airway Mallampati: III  TM Distance: >3 FB Neck ROM: full    Dental no notable dental hx. (+) Teeth Intact   Pulmonary neg pulmonary ROS,    Pulmonary exam normal breath sounds clear to auscultation       Cardiovascular Exercise Tolerance: Good hypertension, (-) angina(-) CAD, (-) Past MI, (-) Cardiac Stents and (-) CABG Normal cardiovascular exam(-) dysrhythmias (-) Valvular Problems/Murmurs Rhythm:regular Rate:Normal     Neuro/Psych negative neurological ROS  negative psych ROS   GI/Hepatic Neg liver ROS, GERD  ,  Endo/Other  negative endocrine ROS  Renal/GU negative Renal ROS  negative genitourinary   Musculoskeletal   Abdominal   Peds  Hematology negative hematology ROS (+)   Anesthesia Other Findings Past Medical History: No date: Back pain, chronic No date: DDD (degenerative disc disease), lumbar No date: Genital herpes No date: GERD (gastroesophageal reflux disease) No date: Hypertension No date: Insomnia   Reproductive/Obstetrics negative OB ROS                             Anesthesia Physical Anesthesia Plan  ASA: II  Anesthesia Plan: General   Post-op Pain Management:    Induction:   Airway Management Planned:   Additional Equipment:   Intra-op Plan:   Post-operative Plan:   Informed Consent: I have reviewed the patients History and Physical, chart, labs and discussed the procedure including the risks, benefits and alternatives for the proposed anesthesia with the patient or authorized representative who has indicated his/her  understanding and acceptance.   Dental Advisory Given  Plan Discussed with: Anesthesiologist, CRNA and Surgeon  Anesthesia Plan Comments:         Anesthesia Quick Evaluation

## 2016-09-18 NOTE — Anesthesia Postprocedure Evaluation (Signed)
Anesthesia Post Note  Patient: Deairah Artuso Arboleda  Procedure(s) Performed: Procedure(s) (LRB): COLONOSCOPY WITH PROPOFOL (N/A)  Patient location during evaluation: Endoscopy Anesthesia Type: General Level of consciousness: awake and alert Pain management: pain level controlled Vital Signs Assessment: post-procedure vital signs reviewed and stable Respiratory status: spontaneous breathing, nonlabored ventilation, respiratory function stable and patient connected to nasal cannula oxygen Cardiovascular status: blood pressure returned to baseline and stable Postop Assessment: no signs of nausea or vomiting Anesthetic complications: no    Last Vitals:  Vitals:   09/18/16 1705 09/18/16 1706  BP: (!) 151/94 128/90  Pulse: 66 64  Resp:    Temp:      Last Pain:  Vitals:   09/18/16 1646  TempSrc: Tympanic                 Martha Clan

## 2016-09-18 NOTE — Transfer of Care (Signed)
Immediate Anesthesia Transfer of Care Note  Patient: Janet Walters  Procedure(s) Performed: Procedure(s): COLONOSCOPY WITH PROPOFOL (N/A)  Patient Location: PACU  Anesthesia Type:General  Level of Consciousness: sedated  Airway & Oxygen Therapy: Patient Spontanous Breathing and Patient connected to nasal cannula oxygen  Post-op Assessment: Report given to RN and Post -op Vital signs reviewed and stable  Post vital signs: Reviewed and stable  Last Vitals:  Vitals:   09/18/16 1517 09/18/16 1646  BP: (!) 146/84 132/81  Pulse: 64 71  Resp: 20 20  Temp: (!) 35.8 C 36.7 C    Last Pain:  Vitals:   09/18/16 1646  TempSrc: Tympanic         Complications: No apparent anesthesia complications

## 2016-09-18 NOTE — Op Note (Signed)
[facility] Gastroenterology Patient Name: Janet Walters Procedure Date: 09/18/2016 4:02 PM MRN: [id number] Account #: [id number] Date of Birth: 10-02-1961 Admit Type: Outpatient Age: 55 Room: [facility] ENDO ROOM 4 Gender: Female Note Status: Finalized Procedure:            Colonoscopy Indications:          Screening in patient at increased risk: Family history                        of 1st-degree relative with colorectal cancer Providers:            Manya Silvas, MD Referring MD:         Vickki Muff. Chrismon, MD (Referring MD) Medicines:            Propofol per Anesthesia Complications:        No immediate complications. Procedure:            Pre-Anesthesia Assessment:                       - After reviewing the risks and benefits, the patient                        was deemed in satisfactory condition to undergo the                        procedure.                       After obtaining informed consent, the colonoscope was                        passed under direct vision. Throughout the procedure,                        the patient's blood pressure, pulse, and oxygen                        saturations were monitored continuously. The                        Colonoscope was introduced through the anus and                        advanced to the the cecum, identified by appendiceal                        orifice and ileocecal valve. The colonoscopy was                        performed without difficulty. The patient tolerated the                        procedure well. The quality of the bowel preparation                        was good. Findings:      A small polyp was found in the transverse colon. The polyp was sessile.       The polyp was removed with a hot snare. Resection and retrieval were       complete.      A  diminutive polyp was found in the sigmoid colon. The polyp was       sessile. The polyp was removed with a jumbo cold forceps. Resection and   retrieval were complete.      Multiple small-mouthed diverticula were found in the sigmoid colon and       descending colon.      Internal hemorrhoids were found during endoscopy. The hemorrhoids were       small and Grade I (internal hemorrhoids that do not prolapse).      The exam was otherwise without abnormality. Impression:           - One small polyp in the transverse colon, removed with                        a hot snare. Resected and retrieved.                       - One diminutive polyp in the sigmoid colon, removed                        with a jumbo cold forceps. Resected and retrieved.                       - Diverticulosis in the sigmoid colon and in the                        descending colon.                       - Internal hemorrhoids.                       - The examination was otherwise normal. Recommendation:       - Await pathology results. Manya Silvas, MD 09/18/2016 4:44:57 PM This report has been signed electronically. Number of Addenda: 0 Note Initiated On: 09/18/2016 4:02 PM Scope Withdrawal Time: 0 hours 13 minutes 37 seconds  Total Procedure Duration: 0 hours 20 minutes 14 seconds       [employer]

## 2016-09-18 NOTE — H&P (Signed)
   Primary Care Physician:  Vernie Murders, PA Primary Gastroenterologist:  Dr. Vira Agar  Pre-Procedure History & Physical: HPI:  Janet Walters is a 55 y.o. female is here for an colonoscopy.   Past Medical History:  Diagnosis Date  . Back pain, chronic   . DDD (degenerative disc disease), lumbar   . Genital herpes   . GERD (gastroesophageal reflux disease)   . Hypertension   . Insomnia     Past Surgical History:  Procedure Laterality Date  . COLONOSCOPY      Prior to Admission medications   Medication Sig Start Date End Date Taking? Authorizing Provider  ibuprofen (ADVIL,MOTRIN) 800 MG tablet TAKE 1 TABLET BY MOUTH 3 TIMES A [place] WITH FOOD AS NEEDED FOR BACK PAIN 03/23/16  Yes Vickki Muff Chrismon, PA  lisinopril-hydrochlorothiazide (PRINZIDE,ZESTORETIC) 20-25 MG tablet TAKE 1 TABLET BY MOUTH DAILY 02/11/16  Yes Vickki Muff Chrismon, PA  traZODone (DESYREL) 50 MG tablet TAKE 0.5-1 TABLETS (25-50 MG TOTAL) BY MOUTH AT BEDTIME AS NEEDED FOR SLEEP. 07/30/16  Yes Dennis E Chrismon, PA  promethazine (PHENERGAN) 25 MG tablet Take 1 tablet (25 mg total) by mouth every 8 (eight) hours as needed for nausea or vomiting. 07/22/16 07/27/16  Trinna Post, PA-C    Allergies as of 08/28/2016  . (No Known Allergies)    Family History  Problem Relation Age of Onset  . Breast cancer Paternal Aunt   . Breast cancer Paternal Aunt   . Breast cancer Paternal Aunt     Social History   Social History  . Marital status: Single    Spouse name: N/A  . Number of children: N/A  . Years of education: N/A   Occupational History  . Not on file.   Social History Main Topics  . Smoking status: Never Smoker  . Smokeless tobacco: Never Used  . Alcohol use No  . Drug use: No  . Sexual activity: Not on file   Other Topics Concern  . Not on file   Social History Narrative  . No narrative on file    Review of Systems: See HPI, otherwise negative ROS  Physical Exam: BP (!) 146/84   Pulse 64    Temp (!) 96.5 F (35.8 C) (Tympanic)   Resp 20   Ht 5\' 7"  (1.702 m)   Wt 102.1 kg (225 lb)   SpO2 100%   BMI 35.24 kg/m  General:   Alert,  pleasant and cooperative in NAD Head:  Normocephalic and atraumatic. Neck:  Supple; no masses or thyromegaly. Lungs:  Clear throughout to auscultation.    Heart:  Regular rate and rhythm. Abdomen:  Soft, nontender and nondistended. Normal bowel sounds, without guarding, and without rebound.   Neurologic:  Alert and  oriented x4;  grossly normal neurologically.  Impression/Plan: [name] is here for an colonoscopy to be performed for family history of  Colon cancer in father.  Risks, benefits, limitations, and alternatives regarding  colonoscopy have been reviewed with the patient.  Questions have been answered.  All parties agreeable.   Gaylyn Cheers, MD  09/18/2016, 4:00 PM

## 2016-09-21 ENCOUNTER — Encounter: Payer: Self-pay | Admitting: Unknown Physician Specialty

## 2016-09-22 LAB — SURGICAL PATHOLOGY

## 2016-09-23 ENCOUNTER — Telehealth: Payer: Self-pay

## 2016-09-23 NOTE — Telephone Encounter (Signed)
[id number] 

## 2016-09-23 NOTE — Telephone Encounter (Signed)
-----   Message from Margo Common, [place] sent at 09/22/2016  5:13 PM EST ----- Normal pathology report from colonoscopy by Dr. Vira Agar. No sign of malignancy. Recheck prn.

## 2016-09-25 NOTE — Telephone Encounter (Signed)
[id number] 

## 2016-09-28 NOTE — Telephone Encounter (Signed)
LMTCB

## 2016-09-29 NOTE — Telephone Encounter (Signed)
Gave message regarding colonoscopy.

## 2016-11-20 ENCOUNTER — Encounter: Payer: Self-pay | Admitting: Family Medicine

## 2016-12-18 ENCOUNTER — Ambulatory Visit (INDEPENDENT_AMBULATORY_CARE_PROVIDER_SITE_OTHER): Payer: BLUE CROSS/BLUE SHIELD | Admitting: Family Medicine

## 2016-12-18 ENCOUNTER — Encounter: Payer: Self-pay | Admitting: Family Medicine

## 2016-12-18 VITALS — BP 120/78 | HR 60 | Temp 98.3°F | Resp 16 | Wt 217.0 lb

## 2016-12-18 DIAGNOSIS — M5136 Other intervertebral disc degeneration, lumbar region: Secondary | ICD-10-CM

## 2016-12-18 DIAGNOSIS — K136 Irritative hyperplasia of oral mucosa: Secondary | ICD-10-CM

## 2016-12-18 MED ORDER — MELOXICAM 15 MG PO TABS: 15.0000 mg | ORAL_TABLET | Freq: Every day | ORAL | 0 refills | Status: DC

## 2016-12-18 NOTE — Progress Notes (Signed)
Subjective:     Patient ID: Janet Walters, female   DOB: [birth year], 58 y.o.   MRN: [id number]  HPI  Chief Complaint  Patient presents with  . Oral Pain    Patient comes in office today with concerns of red spots on roof of her moth and back of throat since last night. Patient denies sore throat or cold like symptoms. Patient states that she rinser her mouth with hydrogen peroxide which seemed to improve the spots. Denies they are painful.   States she had a runny nose and sneezing prior to the onset of her red spots which have resolved. Also states her low back pain has started to bother her and wishes to try a different medication for this.   Review of Systems     Objective:   Physical Exam  Constitutional: She appears well-developed and well-nourished. No distress.  HENT:  Roof of her mouth towards her posterior pharynx with several erythematous lesions with erosive appearance.  Musculoskeletal:  Mildly tender over her upper lumbar vertebra       Assessment:    1. DDD (degenerative disc disease), lumbar - meloxicam (MOBIC) 15 MG tablet; Take 1 tablet (15 mg total) by mouth daily.  Dispense: 30 tablet; Refill: 0  2. Irritation of oral cavity: suspect viral sequela to recent cold sx.     Plan:    Monitor red spots to resolution.

## 2016-12-18 NOTE — Patient Instructions (Signed)
Discussed watching the roof of her mouth for the red spots to resolve on their own. If they become painful or don't improve let us know.

## 2017-03-02 ENCOUNTER — Other Ambulatory Visit: Payer: Self-pay | Admitting: Family Medicine

## 2017-03-02 NOTE — Telephone Encounter (Signed)
[id number] 

## 2017-03-02 NOTE — Telephone Encounter (Signed)
Needs office visit before more refills

## 2017-03-02 NOTE — Telephone Encounter (Signed)
Pt advised/MW °

## 2017-03-02 NOTE — Telephone Encounter (Signed)
Please review for Dennis=aa

## 2017-03-15 ENCOUNTER — Ambulatory Visit (INDEPENDENT_AMBULATORY_CARE_PROVIDER_SITE_OTHER): Payer: BLUE CROSS/BLUE SHIELD | Admitting: Family Medicine

## 2017-03-15 ENCOUNTER — Encounter: Payer: Self-pay | Admitting: Family Medicine

## 2017-03-15 VITALS — BP 120/80 | HR 68 | Temp 97.4°F | Resp 16 | Wt 224.0 lb

## 2017-03-15 DIAGNOSIS — G8929 Other chronic pain: Secondary | ICD-10-CM | POA: Diagnosis not present

## 2017-03-15 DIAGNOSIS — M545 Low back pain: Secondary | ICD-10-CM | POA: Diagnosis not present

## 2017-03-15 DIAGNOSIS — I10 Essential (primary) hypertension: Secondary | ICD-10-CM

## 2017-03-15 MED ORDER — NAPROXEN 500 MG PO TBEC: 500.0000 mg | DELAYED_RELEASE_TABLET | Freq: Two times a day (BID) | ORAL | 3 refills | Status: DC

## 2017-03-15 MED ORDER — LISINOPRIL-HYDROCHLOROTHIAZIDE 20-25 MG PO TABS: 1.0000 | ORAL_TABLET | Freq: Every day | ORAL | 12 refills | Status: DC

## 2017-03-15 NOTE — Patient Instructions (Signed)

## 2017-03-15 NOTE — Progress Notes (Signed)
Patient: Janet Walters Female    DOB: 1961/05/27   56 y.o.   MRN: [id number] Visit Date: 03/15/2017  Today's Provider: Vernie Murders, PA   Chief Complaint  Patient presents with  . Hypertension  . Back Pain   Subjective:    HPI      Hypertension, follow-up:  BP Readings from Last 3 Encounters:  03/15/17 120/80  12/18/16 120/78  09/18/16 128/90    She was last seen for hypertension 1 year ago.  BP at that visit was 102/66. Management since that visit includes checking labs and continuing medications (lisinopril-HCTZ 20/25 mg). She reports good compliance with treatment. She is not having side effects.  She is exercising. Pt has to go up and down ladders and walks a lot at work. Pt also performs yard work. She is adherent to low salt diet.   Outside blood pressures are not being checked. She is experiencing none.  Patient denies chest pain, chest pressure/discomfort, claudication, dyspnea, exertional chest pressure/discomfort, fatigue, irregular heart beat, lower extremity edema, near-syncope, orthopnea, palpitations and syncope.   Cardiovascular risk factors include family history of premature cardiovascular disease and hypertension.    Weight trend: fluctuating a bit Wt Readings from Last 3 Encounters:  03/15/17 224 lb (101.6 kg)  12/18/16 217 lb (98.4 kg)  09/18/16 225 lb (102.1 kg)    Current diet: in general, a "healthy" diet    ------------------------------------------------------------------------  Follow up for Back Pain  The patient was last seen for DDD 3 months ago. Changes made at last visit include adding Meloxicam.  She reports good compliance with treatment. She feels that condition is Unchanged. She is having side effects. Pt reports that the Meloxicam caused pt to "break out" in bumps. She stopped taking the medication, but her back pain has  returned.  ------------------------------------------------------------------------------------  Past Medical History:  Diagnosis Date  . Back pain, chronic   . DDD (degenerative disc disease), lumbar   . Genital herpes   . GERD (gastroesophageal reflux disease)   . Hypertension   . Insomnia    Past Surgical History:  Procedure Laterality Date  . COLONOSCOPY    . COLONOSCOPY WITH PROPOFOL N/A 09/18/2016   Procedure: COLONOSCOPY WITH PROPOFOL;  Surgeon: Manya Silvas, MD;  Location[facility] ENDOSCOPY;  Service: Endoscopy;  Laterality: N/A;   Family History  Problem Relation Age of Onset  . Breast cancer Paternal Aunt   . Breast cancer Paternal Aunt   . Breast cancer Paternal Aunt    No Known Allergies  Current Outpatient Prescriptions:  .  ibuprofen (ADVIL,MOTRIN) 800 MG tablet, TAKE 1 TABLET BY MOUTH 3 TIMES A [place] WITH FOOD AS NEEDED FOR BACK PAIN, Disp: 60 tablet, Rfl: 1 .  lisinopril-hydrochlorothiazide (PRINZIDE,ZESTORETIC) 20-25 MG tablet, TAKE 1 TABLET BY MOUTH DAILY, Disp: 30 tablet, Rfl: 0 .  traZODone (DESYREL) 50 MG tablet, TAKE 0.5-1 TABLETS (25-50 MG TOTAL) BY MOUTH AT BEDTIME AS NEEDED FOR SLEEP., Disp: 30 tablet, Rfl: 5 .  meloxicam (MOBIC) 15 MG tablet, Take 1 tablet (15 mg total) by mouth daily. (Patient not taking: Reported on 03/15/2017), Disp: 30 tablet, Rfl: 0  Review of Systems  Constitutional: Negative for activity change, appetite change, chills, diaphoresis, fatigue, fever and unexpected weight change.  Respiratory: Negative for shortness of breath.   Cardiovascular: Negative for chest pain, palpitations and leg swelling.  Musculoskeletal: Positive for back pain.   Social History  Substance Use Topics  . Smoking status: Never Smoker  .  Smokeless tobacco: Never Used  . Alcohol use No   Objective:   BP 120/80 (BP Location: Left Arm, Patient Position: Sitting, Cuff Size: Large)   Pulse 68   Temp 97.4 F (36.3 C) (Oral)   Resp 16   Wt 224 lb (101.6  kg)   BMI 35.08 kg/m  Vitals:   03/15/17 1609  BP: 120/80  Pulse: 68  Resp: 16  Temp: 97.4 F (36.3 C)  TempSrc: Oral  Weight: 224 lb (101.6 kg)     Physical Exam  Constitutional: She is oriented to person, place, and time. She appears well-developed and well-nourished.  HENT:  Head: Normocephalic.  Right Ear: External ear normal.  Left Ear: External ear normal.  Mouth/Throat: Oropharynx is clear and moist.  Eyes: Conjunctivae are normal.  Neck: Neck supple. No thyromegaly present.  Cardiovascular: Normal rate and regular rhythm.   Pulmonary/Chest: Effort normal and breath sounds normal.  Abdominal: Soft. Bowel sounds are normal.  Musculoskeletal: She exhibits edema.  Soreness in left paravertebral muscles. Sharp pain with transition from sitting to standing. No radiation of pain or numbness.  Neurological: She is alert and oriented to person, place, and time.    Assessment & Plan:     1. Essential (primary) hypertension Tolerating Prinzide 20/25 mg qd. Last labs in August 2017 showed normal renal and liver function tests. Will plan recheck of labs with PAP in the Fall. Refilled BP medication and recheck prn. - lisinopril-hydrochlorothiazide (PRINZIDE,ZESTORETIC) 20-25 MG tablet; Take 1 tablet by mouth daily.  Dispense: 30 tablet; Refill: 12  2. Chronic midline low back pain without sciatica Intermittent sharp pains in left lumbar paravertebral muscle to transition from sitting to standing. No pain to walk or sit down. No radiation to legs or numbness. No specific injury recently. Will give EC-Naprosyn 500 mg BID prn. May add Percogesic for muscle spasms. Apply moist heat and given rehab exercises. Has a history of DDD and facet degeneration of L5-L6 per x-rays on 06-21-13. - naproxen (EC NAPROSYN) 500 MG EC tablet; Take 1 tablet (500 mg total) by mouth 2 (two) times daily with a meal.  Dispense: 60 tablet; Refill: [place]

## 2017-04-02 ENCOUNTER — Other Ambulatory Visit: Payer: Self-pay | Admitting: Family Medicine

## 2017-04-02 DIAGNOSIS — Z1231 Encounter for screening mammogram for malignant neoplasm of breast: Secondary | ICD-10-CM

## 2017-05-06 ENCOUNTER — Ambulatory Visit
Admission: RE | Admit: 2017-05-06 | Discharge: 2017-05-06 | Disposition: A | Discharge status: Home / Self Care | Payer: BLUE CROSS/BLUE SHIELD | Source: Ambulatory Visit | Attending: Family Medicine | Admitting: Family Medicine

## 2017-05-06 DIAGNOSIS — Z1231 Encounter for screening mammogram for malignant neoplasm of breast: Secondary | ICD-10-CM | POA: Diagnosis not present

## 2017-05-08 ENCOUNTER — Other Ambulatory Visit: Payer: Self-pay | Admitting: Family Medicine

## 2017-05-18 ENCOUNTER — Telehealth: Payer: Self-pay

## 2017-05-18 NOTE — Telephone Encounter (Signed)
Patient advised as below.  

## 2017-05-18 NOTE — Telephone Encounter (Signed)
-----   Message from Margo Common, [place] sent at 05/07/2017  9:32 AM EDT ----- Normal mammograms. Recheck in a year.

## 2017-05-31 ENCOUNTER — Ambulatory Visit (INDEPENDENT_AMBULATORY_CARE_PROVIDER_SITE_OTHER): Payer: BLUE CROSS/BLUE SHIELD | Admitting: Physician Assistant

## 2017-05-31 ENCOUNTER — Encounter: Payer: Self-pay | Admitting: Physician Assistant

## 2017-05-31 VITALS — BP 130/90 | HR 62 | Temp 97.8°F | Resp 16 | Ht 67.0 in | Wt 227.6 lb

## 2017-05-31 DIAGNOSIS — Z136 Encounter for screening for cardiovascular disorders: Secondary | ICD-10-CM | POA: Diagnosis not present

## 2017-05-31 DIAGNOSIS — Z0000 Encounter for general adult medical examination without abnormal findings: Secondary | ICD-10-CM

## 2017-05-31 DIAGNOSIS — M545 Low back pain, unspecified: Secondary | ICD-10-CM

## 2017-05-31 DIAGNOSIS — Z833 Family history of diabetes mellitus: Secondary | ICD-10-CM | POA: Diagnosis not present

## 2017-05-31 DIAGNOSIS — G8929 Other chronic pain: Secondary | ICD-10-CM

## 2017-05-31 DIAGNOSIS — Z6835 Body mass index (BMI) 35.0-35.9, adult: Secondary | ICD-10-CM

## 2017-05-31 DIAGNOSIS — Z13220 Encounter for screening for lipoid disorders: Secondary | ICD-10-CM

## 2017-05-31 MED ORDER — NAPROXEN 500 MG PO TBEC: 500.0000 mg | DELAYED_RELEASE_TABLET | Freq: Two times a day (BID) | ORAL | 3 refills | Status: DC

## 2017-05-31 NOTE — Progress Notes (Signed)
Patient: Janet Walters, Female    DOB: December 26, 1960, 56 y.o.   MRN: [id number] Visit Date: 05/31/2017  Today's Provider: Mar Daring, PA-C   Chief Complaint  Patient presents with  . Annual Exam   Subjective:    Annual physical exam Janet Walters is a 56 y.o. female who presents today for health maintenance and complete physical. She feels fairly well. She reports exercising. She reports she is sleeping fairly well.  Last CPE:05/29/16 Pap: 05/29/16 Negative, HPV-Negative  Mammogram:05/06/17 BI-RADS 1 Colon:09/18/16 Polyp, Diverticulosis Tdap:12/18/09 -----------------------------------------------------------------   Review of Systems  Constitutional: Negative.   HENT: Negative.   Eyes: Negative.   Respiratory: Negative.   Cardiovascular: Negative.   Gastrointestinal: Negative.   Endocrine: Negative.   Genitourinary: Negative.   Musculoskeletal: Positive for arthralgias and back pain.  Skin: Negative.   Allergic/Immunologic: Negative.   Neurological: Negative.   Hematological: Negative.   Psychiatric/Behavioral: Negative.     Social History      She  reports that she has never smoked. She has never used smokeless tobacco. She reports that she does not drink alcohol or use drugs.       Social History   Social History  . Marital status: Single    Spouse name: N/A  . Number of children: N/A  . Years of education: N/A   Social History Main Topics  . Smoking status: Never Smoker  . Smokeless tobacco: Never Used  . Alcohol use No  . Drug use: No  . Sexual activity: Not Asked   Other Topics Concern  . None   Social History Narrative  . None    Past Medical History:  Diagnosis Date  . Back pain, chronic   . DDD (degenerative disc disease), lumbar   . Genital herpes   . GERD (gastroesophageal reflux disease)   . Hypertension   . Insomnia      Patient Active Problem List   Diagnosis Date Noted  . Insomnia 02/21/2016  . DDD (degenerative  disc disease), lumbar 01/28/2016  . Leg pain, right 06/13/2015  . Abnormal blood sugar 12/18/2009  . Low back pain 12/27/2007  . Fibroid 09/24/2006  . Genital herpes 09/24/2006  . Acid reflux 09/24/2006  . Family history of cancer of digestive system 09/06/2006  . Family history of diabetes mellitus 09/06/2006  . Essential (primary) hypertension 11/11/2001    Past Surgical History:  Procedure Laterality Date  . COLONOSCOPY    . COLONOSCOPY WITH PROPOFOL N/A 09/18/2016   Procedure: COLONOSCOPY WITH PROPOFOL;  Surgeon: Manya Silvas, MD;  Location: [facility] ENDOSCOPY;  Service: Endoscopy;  Laterality: N/A;    Family History        Family Status  Relation Status  . Ethlyn Daniels (Not Specified)  . Ethlyn Daniels (Not Specified)  . Ethlyn Daniels (Not Specified)        Her family history includes Breast cancer in her paternal aunt, paternal aunt, and paternal aunt.     No Known Allergies   Current Outpatient Prescriptions:  .  lisinopril-hydrochlorothiazide (PRINZIDE,ZESTORETIC) 20-25 MG tablet, Take 1 tablet by mouth daily., Disp: 30 tablet, Rfl: 12 .  naproxen (EC NAPROSYN) 500 MG EC tablet, Take 1 tablet (500 mg total) by mouth 2 (two) times daily with a meal., Disp: 60 tablet, Rfl: 3 .  traZODone (DESYREL) 50 MG tablet, TAKE 0.5-1 TABLETS (25-50 MG TOTAL) BY MOUTH AT BEDTIME AS NEEDED FOR SLEEP., Disp: 30 tablet, Rfl: 5 .  meloxicam (MOBIC)  15 MG tablet, Take 1 tablet (15 mg total) by mouth daily. (Patient not taking: Reported on 03/15/2017), Disp: 30 tablet, Rfl: 0   Patient Care Team: Chrismon, Vickki Muff, PA as PCP - General (Family Medicine)      Objective:   Vitals: BP 130/90 (BP Location: Left Arm, Patient Position: Sitting, Cuff Size: Normal)   Pulse 62   Temp 97.8 F (36.6 C) (Oral)   Resp 16   Ht 5\' 7"  (1.702 m)   Wt 227 lb 9.6 oz (103.2 kg)   BMI 35.65 kg/m    Physical Exam  Constitutional: She is oriented to person, place, and time. She appears well-developed and  well-nourished. No distress.  HENT:  Head: Normocephalic and atraumatic.  Right Ear: Hearing, tympanic membrane, external ear and ear canal normal.  Left Ear: Hearing, tympanic membrane, external ear and ear canal normal.  Nose: Nose normal.  Mouth/Throat: Uvula is midline, oropharynx is clear and moist and mucous membranes are normal. No oropharyngeal exudate.  Eyes: Pupils are equal, round, and reactive to light. Conjunctivae and EOM are normal. Right eye exhibits no discharge. Left eye exhibits no discharge. No scleral icterus.  Neck: Normal range of motion. Neck supple. No JVD present. Carotid bruit is not present. No tracheal deviation present. No thyromegaly present.  Cardiovascular: Normal rate, regular rhythm, normal heart sounds and intact distal pulses.  Exam reveals no gallop and no friction rub.   No murmur heard. Pulmonary/Chest: Effort normal and breath sounds normal. No respiratory distress. She has no wheezes. She has no rales. She exhibits no tenderness. Right breast exhibits no inverted nipple, no mass, no nipple discharge, no skin change and no tenderness. Left breast exhibits no inverted nipple, no mass, no nipple discharge, no skin change and no tenderness. Breasts are symmetrical.  Abdominal: Soft. Bowel sounds are normal. She exhibits no distension and no mass. There is no tenderness. There is no rebound and no guarding.  Musculoskeletal: Normal range of motion. She exhibits no edema or tenderness.  Lymphadenopathy:    She has no cervical adenopathy.  Neurological: She is alert and oriented to person, place, and time. She has normal reflexes.  Skin: Skin is warm and dry. No rash noted. She is not diaphoretic.  Psychiatric: She has a normal mood and affect. Her behavior is normal. Judgment and thought content normal.  Vitals reviewed.   Depression Screen PHQ 2/9 Scores 05/31/2017  PHQ - 2 Score 0      Assessment & Plan:     Routine Health Maintenance and Physical  Exam  Exercise Activities and Dietary recommendations Goals    None      Immunization History  Administered Date(s) Administered  . Hepatitis B, adult 12/23/2009, 05/02/2010  . Meningococcal Conjugate 12/18/2009  . Td 05/01/1998  . Tdap 12/18/2009    Health Maintenance  Topic Date Due  . HIV Screening  07/28/1974  . INFLUENZA VACCINE  05/12/2017  . MAMMOGRAM  05/07/2019  . PAP SMEAR  05/30/2019  . TETANUS/TDAP  12/19/2019  . COLONOSCOPY  09/18/2026  . Hepatitis C Screening  Completed     Discussed health benefits of physical activity, and encouraged her to engage in regular exercise appropriate for her age and condition.    1. Annual physical exam Normal physical exam today. Will check labs as below and f/u pending lab results. If labs are stable and WNL she will not need to have these rechecked for one year at her next annual physical exam. She  is to call the office in the meantime if she has any acute issue, questions or concerns. - CBC with Differential/Platelet - Comprehensive metabolic panel - TSH  2. Family history of diabetes mellitus Will check labs as below and f/u pending results. - Hemoglobin A1c  3. Encounter for lipid screening for cardiovascular disease Will check labs as below and f/u pending results. - Lipid panel  4. BMI 35.0-35.9,adult Counseled patient on healthy lifestyle modifications including dieting and exercise.   5. Chronic midline low back pain without sciatica Refilled naproxen. Take prn. Discussed weight loss.   --------------------------------------------------------------------    Mar Daring, PA-C  [facility]

## 2017-05-31 NOTE — Patient Instructions (Signed)
Health Maintenance for Postmenopausal Women Menopause is a normal process in which your reproductive ability comes to an end. This process happens gradually over a span of months to years, usually between the ages of 100 and 25. Menopause is complete when you have missed 12 consecutive menstrual periods. It is important to talk with your health care provider about some of the most common conditions that affect postmenopausal women, such as heart disease, cancer, and bone loss (osteoporosis). Adopting a healthy lifestyle and getting preventive care can help to promote your health and wellness. Those actions can also lower your chances of developing some of these common conditions. What should I know about menopause? During menopause, you may experience a number of symptoms, such as:  Moderate-to-severe hot flashes.  Night sweats.  Decrease in sex drive.  Mood swings.  Headaches.  Tiredness.  Irritability.  Memory problems.  Insomnia.  Choosing to treat or not to treat menopausal changes is an individual decision that you make with your health care provider. What should I know about hormone replacement therapy and supplements? Hormone therapy products are effective for treating symptoms that are associated with menopause, such as hot flashes and night sweats. Hormone replacement carries certain risks, especially as you become older. If you are thinking about using estrogen or estrogen with progestin treatments, discuss the benefits and risks with your health care provider. What should I know about heart disease and stroke? Heart disease, heart attack, and stroke become more likely as you age. This may be due, in part, to the hormonal changes that your body experiences during menopause. These can affect how your body processes dietary fats, triglycerides, and cholesterol. Heart attack and stroke are both medical emergencies. There are many things that you can do to help prevent heart disease  and stroke:  Have your blood pressure checked at least every 1-2 years. High blood pressure causes heart disease and increases the risk of stroke.  If you are 66-49 years old, ask your health care provider if you should take aspirin to prevent a heart attack or a stroke.  Do not use any tobacco products, including cigarettes, chewing tobacco, or electronic cigarettes. If you need help quitting, ask your health care provider.  It is important to eat a healthy diet and maintain a healthy weight. ? Be sure to include plenty of vegetables, fruits, low-fat dairy products, and lean protein. ? Avoid eating foods that are high in solid fats, added sugars, or salt (sodium).  Get regular exercise. This is one of the most important things that you can do for your health. ? Try to exercise for at least 150 minutes each week. The type of exercise that you do should increase your heart rate and make you sweat. This is known as moderate-intensity exercise. ? Try to do strengthening exercises at least twice each week. Do these in addition to the moderate-intensity exercise.  Know your numbers.Ask your health care provider to check your cholesterol and your blood glucose. Continue to have your blood tested as directed by your health care provider.  What should I know about cancer screening? There are several types of cancer. Take the following steps to reduce your risk and to catch any cancer development as early as possible. Breast Cancer  Practice breast self-awareness. ? This means understanding how your breasts normally appear and feel. ? It also means doing regular breast self-exams. Let your health care provider know about any changes, no matter how small.  If you are 40  or older, have a clinician do a breast exam (clinical breast exam or CBE) every year. Depending on your age, family history, and medical history, it may be recommended that you also have a yearly breast X-ray (mammogram).  If you  have a family history of breast cancer, talk with your health care provider about genetic screening.  If you are at high risk for breast cancer, talk with your health care provider about having an MRI and a mammogram every year.  Breast cancer (BRCA) gene test is recommended for women who have family members with BRCA-related cancers. Results of the assessment will determine the need for genetic counseling and BRCA1 and for BRCA2 testing. BRCA-related cancers include these types: ? Breast. This occurs in males or females. ? Ovarian. ? Tubal. This may also be called fallopian tube cancer. ? Cancer of the abdominal or pelvic lining (peritoneal cancer). ? Prostate. ? Pancreatic.  Cervical, Uterine, and Ovarian Cancer Your health care provider may recommend that you be screened regularly for cancer of the pelvic organs. These include your ovaries, uterus, and vagina. This screening involves a pelvic exam, which includes checking for microscopic changes to the surface of your cervix (Pap test).  For women ages 21-65, health care providers may recommend a pelvic exam and a Pap test every three years. For women ages 79-65, they may recommend the Pap test and pelvic exam, combined with testing for human papilloma virus (HPV), every five years. Some types of HPV increase your risk of cervical cancer. Testing for HPV may also be done on women of any age who have unclear Pap test results.  Other health care providers may not recommend any screening for nonpregnant women who are considered low risk for pelvic cancer and have no symptoms. Ask your health care provider if a screening pelvic exam is right for you.  If you have had past treatment for cervical cancer or a condition that could lead to cancer, you need Pap tests and screening for cancer for at least 20 years after your treatment. If Pap tests have been discontinued for you, your risk factors (such as having a new sexual partner) need to be  reassessed to determine if you should start having screenings again. Some women have medical problems that increase the chance of getting cervical cancer. In these cases, your health care provider may recommend that you have screening and Pap tests more often.  If you have a family history of uterine cancer or ovarian cancer, talk with your health care provider about genetic screening.  If you have vaginal bleeding after reaching menopause, tell your health care provider.  There are currently no reliable tests available to screen for ovarian cancer.  Lung Cancer Lung cancer screening is recommended for adults 69-62 years old who are at high risk for lung cancer because of a history of smoking. A yearly low-dose CT scan of the lungs is recommended if you:  Currently smoke.  Have a history of at least 30 pack-years of smoking and you currently smoke or have quit within the past 15 years. A pack-year is smoking an average of one pack of cigarettes per [place] for one year.  Yearly screening should:  Continue until it has been 15 years since you quit.  Stop if you develop a health problem that would prevent you from having lung cancer treatment.  Colorectal Cancer  This type of cancer can be detected and can often be prevented.  Routine colorectal cancer screening usually begins at  age 42 and continues through age 45.  If you have risk factors for colon cancer, your health care provider may recommend that you be screened at an earlier age.  If you have a family history of colorectal cancer, talk with your health care provider about genetic screening.  Your health care provider may also recommend using home test kits to check for hidden blood in your stool.  A small camera at the end of a tube can be used to examine your colon directly (sigmoidoscopy or colonoscopy). This is done to check for the earliest forms of colorectal cancer.  Direct examination of the colon should be repeated every  5-10 years until age 71. However, if early forms of precancerous polyps or small growths are found or if you have a family history or genetic risk for colorectal cancer, you may need to be screened more often.  Skin Cancer  Check your skin from head to toe regularly.  Monitor any moles. Be sure to tell your health care provider: ? About any new moles or changes in moles, especially if there is a change in a mole's shape or color. ? If you have a mole that is larger than the size of a pencil eraser.  If any of your family members has a history of skin cancer, especially at a young age, talk with your health care provider about genetic screening.  Always use sunscreen. Apply sunscreen liberally and repeatedly throughout the [place].  Whenever you are outside, protect yourself by wearing long sleeves, pants, a wide-brimmed hat, and sunglasses.  What should I know about osteoporosis? Osteoporosis is a condition in which bone destruction happens more quickly than new bone creation. After menopause, you may be at an increased risk for osteoporosis. To help prevent osteoporosis or the bone fractures that can happen because of osteoporosis, the following is recommended:  If you are 46-71 years old, get at least 1,000 mg of calcium and at least 600 mg of vitamin D per [place].  If you are older than age 55 but younger than age 65, get at least 1,200 mg of calcium and at least 600 mg of vitamin D per [place].  If you are older than age 54, get at least 1,200 mg of calcium and at least 800 mg of vitamin D per [place].  Smoking and excessive alcohol intake increase the risk of osteoporosis. Eat foods that are rich in calcium and vitamin D, and do weight-bearing exercises several times each week as directed by your health care provider. What should I know about how menopause affects my mental health? Depression may occur at any age, but it is more common as you become older. Common symptoms of depression  include:  Low or sad mood.  Changes in sleep patterns.  Changes in appetite or eating patterns.  Feeling an overall lack of motivation or enjoyment of activities that you previously enjoyed.  Frequent crying spells.  Talk with your health care provider if you think that you are experiencing depression. What should I know about immunizations? It is important that you get and maintain your immunizations. These include:  Tetanus, diphtheria, and pertussis (Tdap) booster vaccine.  Influenza every year before the flu season begins.  Pneumonia vaccine.  Shingles vaccine.  Your health care provider may also recommend other immunizations. This information is not intended to replace advice given to you by your health care provider. Make sure you discuss any questions you have with your health care provider. Document Released: 11/20/2005  Document Revised: 04/17/2016 Document Reviewed: 07/02/2015 Elsevier Interactive Patient Education  2018 [employer]  

## 2017-06-01 LAB — COMPREHENSIVE METABOLIC PANEL
ALK PHOS: 90 IU/L (ref 39–117)
ALT: 11 IU/L (ref 0–32)
AST: 18 IU/L (ref 0–40)
Albumin/Globulin Ratio: 1.7 (ref 1.2–2.2)
Albumin: 4.2 g/dL (ref 3.5–5.5)
BUN/Creatinine Ratio: 16 (ref 9–23)
BUN: 13 mg/dL (ref 6–24)
Bilirubin Total: 0.3 mg/dL (ref 0.0–1.2)
CO2: 21 mmol/L (ref 20–29)
CREATININE: 0.82 mg/dL (ref 0.57–1.00)
Calcium: 9.4 mg/dL (ref 8.7–10.2)
Chloride: 105 mmol/L (ref 96–106)
GFR calc Af Amer: 92 mL/min/{1.73_m2} (ref >59)
GFR calc non Af Amer: 80 mL/min/{1.73_m2} (ref >59)
GLUCOSE: 93 mg/dL (ref 65–99)
Globulin, Total: 2.5 g/dL (ref 1.5–4.5)
Potassium: 3.8 mmol/L (ref 3.5–5.2)
Sodium: 143 mmol/L (ref 134–144)
Total Protein: 6.7 g/dL (ref 6.0–8.5)

## 2017-06-01 LAB — LIPID PANEL
CHOLESTEROL TOTAL: 160 mg/dL (ref 100–199)
Chol/HDL Ratio: 3 ratio (ref 0.0–4.4)
HDL: 54 mg/dL (ref >39)
LDL Calculated: 87 mg/dL (ref 0–99)
TRIGLYCERIDES: 94 mg/dL (ref 0–149)
VLDL Cholesterol Cal: 19 mg/dL (ref 5–40)

## 2017-06-01 LAB — CBC WITH DIFFERENTIAL/PLATELET
Basophils Absolute: 0 10*3/uL (ref 0.0–0.2)
Basos: 0 %
EOS (ABSOLUTE): 0.1 10*3/uL (ref 0.0–0.4)
Eos: 3 %
HEMOGLOBIN: 13.3 g/dL (ref 11.1–15.9)
Hematocrit: 40.1 % (ref 34.0–46.6)
IMMATURE GRANULOCYTES: 0 %
Immature Grans (Abs): 0 10*3/uL (ref 0.0–0.1)
LYMPHS ABS: 1.8 10*3/uL (ref 0.7–3.1)
LYMPHS: 34 %
MCH: 26.2 pg — ABNORMAL LOW (ref 26.6–33.0)
MCHC: 33.2 g/dL (ref 31.5–35.7)
MCV: 79 fL (ref 79–97)
MONOCYTES: 5 %
Monocytes Absolute: 0.3 10*3/uL (ref 0.1–0.9)
NEUTROS PCT: 58 %
Neutrophils Absolute: 3 10*3/uL (ref 1.4–7.0)
Platelets: 251 10*3/uL (ref 150–379)
RBC: 5.08 x10E6/uL (ref 3.77–5.28)
RDW: 13.4 % (ref 12.3–15.4)
WBC: 5.2 10*3/uL (ref 3.4–10.8)

## 2017-06-01 LAB — HEMOGLOBIN A1C
ESTIMATED AVERAGE GLUCOSE: 120 mg/dL
HEMOGLOBIN A1C: 5.8 % — AB (ref 4.8–5.6)

## 2017-06-01 LAB — TSH: TSH: 2.1 u[IU]/mL (ref 0.450–4.500)

## 2017-06-03 ENCOUNTER — Telehealth: Payer: Self-pay

## 2017-06-03 NOTE — Telephone Encounter (Signed)
-----   Message from Mar Daring, [place] sent at 06/01/2017  8:17 AM EDT ----- All labs are within normal limits and stable with exception of A1c which is slightly elevated at 5.8 from 5.5. Work on healthy lifestyle changes and limit carbs and sugars. Cholesterol is WNL.   Thanks! -JB

## 2017-06-03 NOTE — Telephone Encounter (Signed)
[email or web address]

## 2017-06-03 NOTE — Telephone Encounter (Signed)
Patient advised as below. Patient verbalizes understanding and is in agreement with treatment plan.  

## 2017-06-19 ENCOUNTER — Other Ambulatory Visit: Payer: Self-pay | Admitting: Family Medicine

## 2017-12-28 ENCOUNTER — Ambulatory Visit: Payer: BLUE CROSS/BLUE SHIELD | Admitting: Family Medicine

## 2017-12-28 ENCOUNTER — Encounter: Payer: Self-pay | Admitting: Family Medicine

## 2017-12-28 VITALS — BP 130/68 | HR 83 | Temp 98.4°F

## 2017-12-28 DIAGNOSIS — M5136 Other intervertebral disc degeneration, lumbar region: Secondary | ICD-10-CM

## 2017-12-28 MED ORDER — PREDNISONE 10 MG PO TABS: 10.0000 mg | ORAL_TABLET | Freq: Every day | ORAL | 0 refills | Status: DC

## 2017-12-28 NOTE — Progress Notes (Signed)
Patient: Janet Walters Female    DOB: May 21, 1961   57 y.o.   MRN: [id number] Visit Date: 12/28/2017  Today's Provider: Vernie Murders, PA   Chief Complaint  Patient presents with  . Back Pain   Subjective:    Back Pain  This is a recurrent problem. The current episode started more than 1 year ago. The problem occurs intermittently. The problem has been gradually worsening since onset. The pain is present in the lumbar spine. The pain radiates to the right thigh, left thigh, right knee and left knee. The pain is worse during the night. The symptoms are aggravated by lying down. Associated symptoms include leg pain. Pertinent negatives include no numbness, tingling or weakness. Risk factors: DDD.   Past Medical History:  Diagnosis Date  . Back pain, chronic   . DDD (degenerative disc disease), lumbar   . Genital herpes   . GERD (gastroesophageal reflux disease)   . Hypertension   . Insomnia    Past Surgical History:  Procedure Laterality Date  . COLONOSCOPY    . COLONOSCOPY WITH PROPOFOL N/A 09/18/2016   Procedure: COLONOSCOPY WITH PROPOFOL;  Surgeon: Manya Silvas, MD;  Location: [facility] ENDOSCOPY;  Service: Endoscopy;  Laterality: N/A;   Family History  Problem Relation Age of Onset  . Breast cancer Paternal Aunt   . Breast cancer Paternal Aunt   . Breast cancer Paternal Aunt    No Known Allergies  Current Outpatient Medications:  .  lisinopril-hydrochlorothiazide (PRINZIDE,ZESTORETIC) 20-25 MG tablet, Take 1 tablet by mouth daily., Disp: 30 tablet, Rfl: 12 .  traZODone (DESYREL) 50 MG tablet, Take by mouth., Disp: , Rfl:   Review of Systems  Constitutional: Negative.   Respiratory: Negative.   Cardiovascular: Negative.   Musculoskeletal: Positive for back pain.       Leg pain   Neurological: Negative for tingling, weakness and numbness.   Social History   Tobacco Use  . Smoking status: Never Smoker  . Smokeless tobacco: Never Used  Substance Use Topics    . Alcohol use: No    Alcohol/week: 0.0 oz   Objective:   BP 130/68 (BP Location: Right Arm, Patient Position: Sitting, Cuff Size: Normal)   Pulse 83   Temp 98.4 F (36.9 C) (Oral)   SpO2 97%   Physical Exam  Constitutional: She is oriented to person, place, and time. She appears well-developed and well-nourished. No distress.  HENT:  Head: Normocephalic and atraumatic.  Right Ear: Hearing normal.  Left Ear: Hearing normal.  Nose: Nose normal.  Eyes: Conjunctivae and lids are normal. Right eye exhibits no discharge. Left eye exhibits no discharge. No scleral icterus.  Neck: Neck supple.  Cardiovascular: Normal rate.  Pulmonary/Chest: Effort normal and breath sounds normal. No respiratory distress.  Musculoskeletal: Normal range of motion. She exhibits tenderness.  Tenderness in lower lumbar spine to palpation with some radiation to posterior thighs bilaterally. Good muscle strength. SLR's 90 degrees without pain.  Neurological: She is alert and oriented to person, place, and time. She has normal reflexes.  Skin: Skin is intact. No lesion and no rash noted.  Psychiatric: She has a normal mood and affect. Her speech is normal and behavior is normal. Thought content normal.      Assessment & Plan:     1. DDD (degenerative disc disease), lumbar History of degenerative changes of facet joints and disc degeneration (especially L5-S1) on lumbar spine x-rays of 2014. Also, has degenerative changes note in  the right hip and tricompartment osteoarthritis of the right knee on x-rays of 2016. Recent flare of back with radiation into the posterior thighs bilaterally causing much difficulty transitioning from sitting to standing. Naproxen stopped helping so she stopped taking it. Will treat with prednisone taper and continue Percogesic for spasms. Apply moist heat and may use an OTC TENS unit. Advised to consider physical therapy after acute discomfort abates. Will call report of progress in 10  days to schedule PT. - predniSONE (DELTASONE) 10 MG tablet; Take 1 tablet (10 mg total) by mouth daily with breakfast. Taper down by one tablet every other [place] (6,6,5,5,4,4,3,3,2,2,1,1)  Dispense: 42 tablet; Refill: [place], PA  [facility]

## 2018-01-10 ENCOUNTER — Telehealth: Payer: Self-pay | Admitting: Family Medicine

## 2018-01-10 NOTE — Telephone Encounter (Signed)
Patient is calling to let you know how things are going from taking Prednisone. It helped her while she was taking it but now that she has stopped, the pain is back in her hip.  She feels drained and weak.

## 2018-02-19 DIAGNOSIS — M79604 Pain in right leg: Secondary | ICD-10-CM | POA: Diagnosis not present

## 2018-02-19 DIAGNOSIS — M79605 Pain in left leg: Secondary | ICD-10-CM | POA: Diagnosis not present

## 2018-03-18 ENCOUNTER — Other Ambulatory Visit: Payer: Self-pay | Admitting: Family Medicine

## 2018-03-18 DIAGNOSIS — I10 Essential (primary) hypertension: Secondary | ICD-10-CM

## 2018-04-15 ENCOUNTER — Telehealth: Payer: Self-pay

## 2018-04-15 NOTE — Telephone Encounter (Signed)
Pt is requesting a referral her recurrent leg pain. Has been seen for this previously, and was prescribed prednisone. The steroids helped, but pain returned after the course of medication ended. Please advise.

## 2018-04-15 NOTE — Telephone Encounter (Signed)
Schedule appointment to see if need MRI scan, referral to physical therapy or spine specialist.

## 2018-04-17 ENCOUNTER — Other Ambulatory Visit: Payer: Self-pay

## 2018-04-17 ENCOUNTER — Emergency Department
Admission: EM | Admit: 2018-04-17 | Discharge: 2018-04-17 | Disposition: A | Discharge status: Home / Self Care | Payer: BLUE CROSS/BLUE SHIELD | Attending: Emergency Medicine | Admitting: Emergency Medicine

## 2018-04-17 ENCOUNTER — Emergency Department: Payer: BLUE CROSS/BLUE SHIELD

## 2018-04-17 ENCOUNTER — Encounter: Payer: Self-pay | Admitting: Emergency Medicine

## 2018-04-17 DIAGNOSIS — R140 Abdominal distension (gaseous): Secondary | ICD-10-CM | POA: Diagnosis not present

## 2018-04-17 DIAGNOSIS — Z79899 Other long term (current) drug therapy: Secondary | ICD-10-CM | POA: Insufficient documentation

## 2018-04-17 DIAGNOSIS — I10 Essential (primary) hypertension: Secondary | ICD-10-CM | POA: Insufficient documentation

## 2018-04-17 DIAGNOSIS — R1013 Epigastric pain: Secondary | ICD-10-CM | POA: Diagnosis not present

## 2018-04-17 DIAGNOSIS — R1010 Upper abdominal pain, unspecified: Secondary | ICD-10-CM

## 2018-04-17 DIAGNOSIS — R350 Frequency of micturition: Secondary | ICD-10-CM | POA: Diagnosis not present

## 2018-04-17 LAB — COMPREHENSIVE METABOLIC PANEL
ALT: 11 U/L (ref 0–44)
ANION GAP: 10 (ref 5–15)
AST: 21 U/L (ref 15–41)
Albumin: 4.3 g/dL (ref 3.5–5.0)
Alkaline Phosphatase: 80 U/L (ref 38–126)
BUN: 10 mg/dL (ref 6–20)
CALCIUM: 9.5 mg/dL (ref 8.9–10.3)
CHLORIDE: 102 mmol/L (ref 98–111)
CO2: 28 mmol/L (ref 22–32)
Creatinine, Ser: 0.67 mg/dL (ref 0.44–1.00)
Glucose, Bld: 102 mg/dL — ABNORMAL HIGH (ref 70–99)
Potassium: 3.5 mmol/L (ref 3.5–5.1)
SODIUM: 140 mmol/L (ref 135–145)
Total Bilirubin: 1 mg/dL (ref 0.3–1.2)
Total Protein: 7.6 g/dL (ref 6.5–8.1)

## 2018-04-17 LAB — URINALYSIS, COMPLETE (UACMP) WITH MICROSCOPIC
BILIRUBIN URINE: NEGATIVE
Bacteria, UA: NONE SEEN
Glucose, UA: NEGATIVE mg/dL
Ketones, ur: NEGATIVE mg/dL
LEUKOCYTES UA: NEGATIVE
Nitrite: NEGATIVE
PROTEIN: NEGATIVE mg/dL
Specific Gravity, Urine: 1.015 (ref 1.005–1.030)
pH: 7 (ref 5.0–8.0)

## 2018-04-17 LAB — CBC
HEMATOCRIT: 42.4 % (ref 35.0–47.0)
Hemoglobin: 14.3 g/dL (ref 12.0–16.0)
MCH: 26.9 pg (ref 26.0–34.0)
MCHC: 33.7 g/dL (ref 32.0–36.0)
MCV: 79.7 fL — AB (ref 80.0–100.0)
PLATELETS: 242 10*3/uL (ref 150–440)
RBC: 5.32 MIL/uL — ABNORMAL HIGH (ref 3.80–5.20)
RDW: 13.2 % (ref 11.5–14.5)
WBC: 11.5 10*3/uL — AB (ref 3.6–11.0)

## 2018-04-17 LAB — LIPASE, BLOOD: LIPASE: 36 U/L (ref 11–51)

## 2018-04-17 MED ORDER — FAMOTIDINE 20 MG PO TABS
40.0000 mg | ORAL_TABLET | Freq: Once | ORAL | Status: AC
  Administered 2018-04-17: 40 mg via ORAL

## 2018-04-17 MED ORDER — FAMOTIDINE 20 MG PO TABS
ORAL_TABLET | ORAL | Status: AC
  Administered 2018-04-17: 40 mg via ORAL
  Filled 2018-04-17: qty 2

## 2018-04-17 MED ORDER — SUCRALFATE 1 G PO TABS: 1.0000 g | ORAL_TABLET | Freq: Four times a day (QID) | ORAL | 1 refills | Status: DC

## 2018-04-17 MED ORDER — KETOROLAC TROMETHAMINE 30 MG/ML IJ SOLN
INTRAMUSCULAR | Status: AC
  Administered 2018-04-17: 15 mg via INTRAVENOUS
  Filled 2018-04-17: qty 1

## 2018-04-17 MED ORDER — KETOROLAC TROMETHAMINE 30 MG/ML IJ SOLN
15.0000 mg | INTRAMUSCULAR | Status: AC
  Administered 2018-04-17: 15 mg via INTRAVENOUS

## 2018-04-17 MED ORDER — GI COCKTAIL ~~LOC~~
30.0000 mL | Freq: Once | ORAL | Status: AC
  Administered 2018-04-17: 30 mL via ORAL
  Filled 2018-04-17: qty 30

## 2018-04-17 MED ORDER — LOPERAMIDE HCL 2 MG PO TABS: 4.0000 mg | ORAL_TABLET | Freq: Four times a day (QID) | ORAL | 0 refills | Status: DC | PRN

## 2018-04-17 NOTE — ED Provider Notes (Signed)
[facility] Emergency Department Provider Note   ____________________________________________    I have reviewed the triage vital signs and the nursing notes.   HISTORY  Chief Complaint Abdominal Pain     HPI Janet Walters is a 57 y.o. female who presents with complaints of abdominal pain.  Patient reports epigastric abdominal pain which started this morning she describes it as a moderate burning sensation.  She does have a history of GERD, took an acid reliever with no improvement in pain.  She reports the pain does radiate mildly to her back.  No history of abdominal surgeries.  No fevers or chills.  No nausea or vomiting or diarrhea   Past Medical History:  Diagnosis Date  . Back pain, chronic   . DDD (degenerative disc disease), lumbar   . Genital herpes   . GERD (gastroesophageal reflux disease)   . Hypertension   . Insomnia     Patient Active Problem List   Diagnosis Date Noted  . Insomnia 02/21/2016  . DDD (degenerative disc disease), lumbar 01/28/2016  . Leg pain, right 06/13/2015  . Abnormal blood sugar 12/18/2009  . Low back pain 12/27/2007  . Fibroid 09/24/2006  . Genital herpes 09/24/2006  . Acid reflux 09/24/2006  . Family history of cancer of digestive system 09/06/2006  . Family history of diabetes mellitus 09/06/2006  . Essential (primary) hypertension 11/11/2001    Past Surgical History:  Procedure Laterality Date  . COLONOSCOPY    . COLONOSCOPY WITH PROPOFOL N/A 09/18/2016   Procedure: COLONOSCOPY WITH PROPOFOL;  Surgeon: Manya Silvas, MD;  Location: [facility] ENDOSCOPY;  Service: Endoscopy;  Laterality: N/A;    Prior to Admission medications   Medication Sig Start Date End Date Taking? Authorizing Provider  lisinopril-hydrochlorothiazide (PRINZIDE,ZESTORETIC) 20-25 MG tablet TAKE 1 TABLET BY MOUTH EVERY [place] 03/18/18   Chrismon, Vickki Muff, PA  predniSONE (DELTASONE) 10 MG tablet Take 1 tablet (10 mg total) by mouth daily  with breakfast. Taper down by one tablet every other [place] (6,6,5,5,4,4,3,3,2,2,1,1) 12/28/17   Chrismon, Vickki Muff, PA  traZODone (DESYREL) 50 MG tablet Take by mouth.    [provider]     Allergies Patient has no known allergies.  Family History  Problem Relation Age of Onset  . Breast cancer Paternal Aunt   . Breast cancer Paternal Aunt   . Breast cancer Paternal Aunt     Social History Social History   Tobacco Use  . Smoking status: Never Smoker  . Smokeless tobacco: Never Used  Substance Use Topics  . Alcohol use: No    Alcohol/week: 0.0 oz  . Drug use: No    Review of Systems  Constitutional: No fever/chills Eyes: No visual changes.  ENT: No sore throat. Cardiovascular: Denies chest pain. Respiratory: Denies shortness of breath. Gastrointestinal: As above Genitourinary: Negative for dysuria. Musculoskeletal: Negative for back pain. Skin: Negative for rash. Neurological: Negative for headaches or weakness   ____________________________________________   PHYSICAL EXAM:  VITAL SIGNS: ED Triage Vitals  Enc Vitals Group     BP 04/17/18 1352 (!) 143/84     Pulse Rate 04/17/18 1352 74     Resp 04/17/18 1352 18     Temp 04/17/18 1352 98.8 F (37.1 C)     Temp Source 04/17/18 1352 Oral     SpO2 04/17/18 1352 98 %     Weight 04/17/18 1350 103.6 kg (228 lb 6.4 oz)     Height 04/17/18 1350 1.676 m (5'  6")     Head Circumference --      Peak Flow --      Pain Score 04/17/18 1350 8     Pain Loc --      Pain Edu? --      Excl. in [place]? --     Constitutional: Alert and oriented.  Eyes: Conjunctivae are normal.    Mouth/Throat: Mucous membranes are moist.   Neck:  Painless ROM Cardiovascular: Normal rate, regular rhythm. Grossly normal heart sounds.  Good peripheral circulation. Respiratory: Normal respiratory effort.  No retractions. Lungs CTAB. Gastrointestinal: Mild epigastric tenderness to palpation. No distention.  No CVA  tenderness.  Musculoskeletal: No lower extremity tenderness nor edema.  Warm and well perfused Neurologic:  Normal speech and language. No gross focal neurologic deficits are appreciated.  Skin:  Skin is warm, dry and intact. No rash noted. Psychiatric: Mood and affect are normal. Speech and behavior are normal.  ____________________________________________   LABS (all labs ordered are listed, but only abnormal results are displayed)  Labs Reviewed  LIPASE, BLOOD  COMPREHENSIVE METABOLIC PANEL  CBC  URINALYSIS, COMPLETE (UACMP) WITH MICROSCOPIC   ____________________________________________  EKG  ED ECG REPORT I, Lavonia Drafts, the attending physician, personally viewed and interpreted this ECG.  Date: 04/17/2018  Rhythm: normal sinus rhythm QRS Axis: normal Intervals: Left anterior fascicular block ST/T Wave abnormalities: normal Narrative Interpretation: no evidence of acute ischemia  ____________________________________________  RADIOLOGY  Ultrasound right upper quadrant pending ____________________________________________   PROCEDURES  Procedure(s) performed: No  Procedures   Critical Care performed: No ____________________________________________   INITIAL IMPRESSION / ASSESSMENT AND PLAN / ED COURSE  Pertinent labs & imaging results that were available during my care of the patient were reviewed by me and considered in my medical decision making (see chart for details).  Patient presents with epigastric abdominal discomfort.  Differential includes gastritis/PUD/GERD/pancreatitis/cholelithiasis.  Will trial GI cocktail, obtain ultrasound of the right upper quadrant, labs including lipase and reevaluate.  I have asked Dr. Joni Fears to follow-up on ultrasound report and reevaluate the patient    ____________________________________________   FINAL CLINICAL IMPRESSION(S) / ED DIAGNOSES  Final diagnoses:  Upper abdominal pain        Note:   This document was prepared using Dragon voice recognition software and may include unintentional dictation errors.    Lavonia Drafts, MD 04/17/18 1459

## 2018-04-17 NOTE — ED Notes (Signed)
IV team at bedside 

## 2018-04-17 NOTE — ED Notes (Signed)
Attempted to obtain blood x 2. Unsuccessful x 2.   Pt taken to [place] via stretcher to not delay pt care.

## 2018-04-17 NOTE — ED Triage Notes (Signed)
Pt arrived via POV from [facility] with reports of abdominal pain in the epigastric and RLQ regions.  Pain started yesterday.  Pt reports the epigastric pain radiates to her back. Pt has hx of acid reflux, but states the medication she took did not help.  Pt reports she vomited last night and states she feels bloated.

## 2018-04-17 NOTE — ED Notes (Signed)
Pt states her brother is going to get her food and drink for PO challenge. Pt denies any other needs at this time, is ready to go home. States pain decreased with last medication administration.

## 2018-04-17 NOTE — ED Notes (Signed)
Dr. Joni Fears made aware that pt stated that GI cocktail didn't help her epigastric pain.

## 2018-04-17 NOTE — ED Notes (Signed)
[place] RN was able to get light green tube and sent to lab.

## 2018-04-17 NOTE — ED Provider Notes (Signed)
 -----------------------------------------   6:31 PM on 04/17/2018 -----------------------------------------  Patient updated on ultrasound results.  Lab has still not been able to provide results so a new sample is being obtained.  Patient states that she does feel better after receiving antacids here in the ED, and feels ready to go home.  She is willing to stay for chemistry results.  She does note that she takes antacids at home but only intermittently.  We will p.o. trial, plan to discharge if there are no severe findings on labs.   Carrie Mew, MD 04/17/18 [phone]

## 2018-04-17 NOTE — ED Notes (Signed)
[place] EDT attempted to straight stick pt twice with no success. Asking Delilah Shan, charge RN to take a look.

## 2018-04-17 NOTE — ED Notes (Signed)
Pt ambulatory to treatment room. No distress noted.

## 2018-04-18 NOTE — Telephone Encounter (Signed)
Patient advised. She states she was seen at ER on 04/17/18 and advised to follow up with PCP. ER F/U and discuss referral scheduled for 04/21/18.

## 2018-04-21 ENCOUNTER — Ambulatory Visit: Payer: BLUE CROSS/BLUE SHIELD | Admitting: Family Medicine

## 2018-04-21 ENCOUNTER — Encounter: Payer: Self-pay | Admitting: Family Medicine

## 2018-04-21 VITALS — BP 118/80 | HR 65 | Temp 97.9°F | Wt 225.8 lb

## 2018-04-21 DIAGNOSIS — K219 Gastro-esophageal reflux disease without esophagitis: Secondary | ICD-10-CM

## 2018-04-21 DIAGNOSIS — F413 Other mixed anxiety disorders: Secondary | ICD-10-CM | POA: Diagnosis not present

## 2018-04-21 DIAGNOSIS — R1010 Upper abdominal pain, unspecified: Secondary | ICD-10-CM

## 2018-04-21 DIAGNOSIS — N390 Urinary tract infection, site not specified: Secondary | ICD-10-CM

## 2018-04-21 DIAGNOSIS — I10 Essential (primary) hypertension: Secondary | ICD-10-CM

## 2018-04-21 LAB — POCT URINALYSIS DIPSTICK
BILIRUBIN UA: NEGATIVE
Glucose, UA: NEGATIVE
KETONES UA: NEGATIVE
Leukocytes, UA: NEGATIVE
Nitrite, UA: NEGATIVE
Protein, UA: NEGATIVE
SPEC GRAV UA: 1.02 (ref 1.010–1.025)
Urobilinogen, UA: 0.2 E.U./dL
pH, UA: 6 (ref 5.0–8.0)

## 2018-04-21 MED ORDER — NITROFURANTOIN MONOHYD MACRO 100 MG PO CAPS: 100.0000 mg | ORAL_CAPSULE | Freq: Two times a day (BID) | ORAL | 0 refills | Status: DC

## 2018-04-21 MED ORDER — HYOSCYAMINE SULFATE 0.125 MG PO TABS: 0.1250 mg | ORAL_TABLET | ORAL | 0 refills | Status: DC | PRN

## 2018-04-21 NOTE — Progress Notes (Deleted)
       Patient: Janet Walters Female    DOB: August 20, 1961   57 y.o.   MRN: [id number] Visit Date: 04/21/2018  Today's Provider: Vernie Murders, PA   No chief complaint on file.  Subjective:    HPI  Follow up ER visit  Patient was seen in ER for abdominal pain on 04/17/18. She was treated for upper abdominal pain. Treatment for this included advised to start Immodium AD and Carafate. She reports {DESC; EXCELLENT/GOOD/FAIR:19992} compliance with treatment. She reports this condition is {improved/worse/unchanged:3041574}.  ------------------------------------------------------------------------------------   No Known Allergies   Current Outpatient Medications:  .  lisinopril-hydrochlorothiazide (PRINZIDE,ZESTORETIC) 20-25 MG tablet, TAKE 1 TABLET BY MOUTH EVERY [place], Disp: 30 tablet, Rfl: 8 .  loperamide (IMODIUM A-D) 2 MG tablet, Take 2 tablets (4 mg total) by mouth 4 (four) times daily as needed for diarrhea or loose stools., Disp: 30 tablet, Rfl: 0 .  sucralfate (CARAFATE) 1 g tablet, Take 1 tablet (1 g total) by mouth 4 (four) times daily., Disp: 120 tablet, Rfl: 1 .  traZODone (DESYREL) 50 MG tablet, Take by mouth., Disp: , Rfl:   Review of Systems  Constitutional: Negative.   Respiratory: Negative.   Cardiovascular: Negative.     Social History   Tobacco Use  . Smoking status: Never Smoker  . Smokeless tobacco: Never Used  Substance Use Topics  . Alcohol use: No    Alcohol/week: 0.0 oz   Objective:   There were no vitals taken for this visit.   Physical Exam      Assessment & Plan:           Vernie Murders, PA  [facility]

## 2018-04-21 NOTE — Progress Notes (Signed)
Patient: Janet Walters Female    DOB: Jan 02, 1961   57 y.o.   MRN: [id number] Visit Date: 04/21/2018  Today's Provider: Vernie Murders, PA   Chief Complaint  Patient presents with  . ER Follow Up   Subjective:    HPI  Follow up ER visit  Patient was seen in ER for abdominal pain on 04/17/18. She was treated for upper abdominal pain. Treatment for this included advised to start Immodium AD and Carafate. She reports good compliance with treatment. She reports this condition is slightly improved. Patient states she was nauseated and bloated this morning.  ------------------------------------------------------------------------------------ Patient is also requesting a pain clinic or PT referral for bilateral leg pain. She states the pain is gradually worsening.  Past Medical History:  Diagnosis Date  . Back pain, chronic   . DDD (degenerative disc disease), lumbar   . Genital herpes   . GERD (gastroesophageal reflux disease)   . Hypertension   . Insomnia    Past Surgical History:  Procedure Laterality Date  . COLONOSCOPY    . COLONOSCOPY WITH PROPOFOL N/A 09/18/2016   Procedure: COLONOSCOPY WITH PROPOFOL;  Surgeon: Manya Silvas, MD;  Location: [facility] ENDOSCOPY;  Service: Endoscopy;  Laterality: N/A;   Family History  Problem Relation Age of Onset  . Breast cancer Paternal Aunt   . Breast cancer Paternal Aunt   . Breast cancer Paternal Aunt    No Known Allergies  Current Outpatient Medications:  .  lisinopril-hydrochlorothiazide (PRINZIDE,ZESTORETIC) 20-25 MG tablet, TAKE 1 TABLET BY MOUTH EVERY [place], Disp: 30 tablet, Rfl: 8 .  loperamide (IMODIUM A-D) 2 MG tablet, Take 2 tablets (4 mg total) by mouth 4 (four) times daily as needed for diarrhea or loose stools., Disp: 30 tablet, Rfl: 0 .  sucralfate (CARAFATE) 1 g tablet, Take 1 tablet (1 g total) by mouth 4 (four) times daily., Disp: 120 tablet, Rfl: 1 .  traZODone (DESYREL) 50 MG tablet, Take by mouth., Disp: , Rfl:     Review of Systems  Constitutional: Negative.   Respiratory: Negative.   Cardiovascular: Negative.   Gastrointestinal: Positive for abdominal distention and nausea.    Social History   Tobacco Use  . Smoking status: Never Smoker  . Smokeless tobacco: Never Used  Substance Use Topics  . Alcohol use: No    Alcohol/week: 0.0 oz   Objective:   BP 118/80 (BP Location: Right Arm, Patient Position: Sitting, Cuff Size: Normal)   Pulse 65   Temp 97.9 F (36.6 C) (Oral)   Wt 225 lb 12.8 oz (102.4 kg)   SpO2 99%   BMI 36.45 kg/m    Physical Exam  Constitutional: She is oriented to person, place, and time. She appears well-developed and well-nourished. No distress.  HENT:  Head: Normocephalic and atraumatic.  Right Ear: Hearing normal.  Left Ear: Hearing normal.  Nose: Nose normal.  Eyes: Conjunctivae and lids are normal. Right eye exhibits no discharge. Left eye exhibits no discharge. No scleral icterus.  Neck: Neck supple.  Cardiovascular: Normal rate and regular rhythm.  Pulmonary/Chest: Effort normal and breath sounds normal. No respiratory distress.  Abdominal: Soft. Bowel sounds are normal. She exhibits no mass. There is no tenderness. There is no guarding.  Musculoskeletal: Normal range of motion.  Neurological: She is alert and oriented to person, place, and time.  Skin: Skin is intact. No lesion and no rash noted.  Psychiatric: She has a normal mood and affect. Her speech is normal and behavior  is normal. Thought content normal.      Assessment & Plan:     1. Upper abdominal pain Onset on 04-16-18 after mowing at her brother's house and developing nausea. Want to the Urgent Care on Saturday evening but symptoms returned the next [place]. Went to the ER on 04-17-18 and lab tests with RUQ ultrasound was negative. Was given Carafate and Pepcid with minimal relief. Recommend switching to Zantac 150 mg BID and adding Levsin antispasmodic. Bland diet and limiting strenuous  activities requiring bending over or pushing/lifting. - POCT Urinalysis Dipstick - Urine Culture - hyoscyamine (LEVSIN, ANASPAZ) 0.125 MG tablet; Take 1 tablet (0.125 mg total) by mouth every 4 (four) hours as needed.  Dispense: 30 tablet; Refill: 0  2. Gastroesophageal reflux disease, esophagitis presence not specified History of GERD and with recent epigastric discomfort without melena, hematemesis and normal blood tests in the ER, will treat with Zantac 150 mg BID for a month. Limit spicy and greasy foods. Decrease carbonated or caffenated beverages.  3. Essential (primary) hypertension Tolerating Zestoretic 20-25 mg qd. Continue to limit salt intake and drink plenty of fluids during the hot summer weather. Recent Results (from the past 2160 hour(s))  Urinalysis, Complete w Microscopic     Status: Abnormal   Collection Time: 04/17/18  1:53 PM  Result Value Ref Range   Color, Urine YELLOW (A) YELLOW   APPearance CLEAR (A) CLEAR   Specific Gravity, Urine 1.015 1.005 - 1.030   pH 7.0 5.0 - 8.0   Glucose, UA NEGATIVE NEGATIVE mg/dL   Hgb urine dipstick SMALL (A) NEGATIVE   Bilirubin Urine NEGATIVE NEGATIVE   Ketones, ur NEGATIVE NEGATIVE mg/dL   Protein, ur NEGATIVE NEGATIVE mg/dL   Nitrite NEGATIVE NEGATIVE   Leukocytes, UA NEGATIVE NEGATIVE   RBC / HPF 6-10 0 - 5 RBC/hpf   WBC, UA 21-50 0 - 5 WBC/hpf   Bacteria, UA NONE SEEN NONE SEEN   Squamous Epithelial / LPF 0-5 0 - 5   Mucus PRESENT     Comment: Performed at [facility], [address] [place], [place] [address]  CBC     Status: Abnormal   Collection Time: 04/17/18  2:22 PM  Result Value Ref Range   WBC 11.5 (H) 3.6 - 11.0 K/uL   RBC 5.32 (H) 3.80 - 5.20 MIL/uL   Hemoglobin 14.3 12.0 - 16.0 g/dL   HCT 42.4 35.0 - 47.0 %   MCV 79.7 (L) 80.0 - 100.0 fL   MCH 26.9 26.0 - 34.0 pg   MCHC 33.7 32.0 - 36.0 g/dL   RDW 13.2 11.5 - 14.5 %   Platelets 242 150 - 440 K/uL    Comment: Performed at [facility],  [address] [place], [place] [address]  Comprehensive metabolic panel     Status: Abnormal   Collection Time: 04/17/18  6:33 PM  Result Value Ref Range   Sodium 140 135 - 145 mmol/L   Potassium 3.5 3.5 - 5.1 mmol/L   Chloride 102 98 - 111 mmol/L    Comment: Please note change in reference range.   CO2 28 22 - 32 mmol/L   Glucose, Bld 102 (H) 70 - 99 mg/dL    Comment: Please note change in reference range.   BUN 10 6 - 20 mg/dL    Comment: Please note change in reference range.   Creatinine, Ser 0.67 0.44 - 1.00 mg/dL   Calcium 9.5 8.9 - 10.3 mg/dL   Total Protein 7.6  6.5 - 8.1 g/dL   Albumin 4.3 3.5 - 5.0 g/dL   AST 21 15 - 41 U/L   ALT 11 0 - 44 U/L    Comment: Please note change in reference range.   Alkaline Phosphatase 80 38 - 126 U/L   Total Bilirubin 1.0 0.3 - 1.2 mg/dL   GFR calc non Af Amer >60 >60 mL/min   GFR calc Af Amer >60 >60 mL/min    Comment: (NOTE) The eGFR has been calculated using the CKD EPI equation. This calculation has not been validated in all clinical situations. eGFR's persistently <60 mL/min signify possible Chronic Kidney Disease.    Anion gap 10 5 - 15    Comment: Performed at [facility], [address] [place], [place] [address]  Lipase, blood     Status: None   Collection Time: 04/17/18  6:33 PM  Result Value Ref Range   Lipase 36 11 - 51 U/L    Comment: Performed at [facility], [address] [place], [place] [address]  POCT Urinalysis Dipstick     Status: None   Collection Time: 04/21/18 11:54 AM  Result Value Ref Range   Color, UA     Clarity, UA     Glucose, UA Negative Negative   Bilirubin, UA negative    Ketones, UA negative    Spec Grav, UA 1.020 1.010 - 1.025   Blood, UA hemolyzed trace    pH, UA 6.0 5.0 - 8.0   Protein, UA Negative Negative   Urobilinogen, UA 0.2 0.2 or 1.0 E.U./dL   Nitrite, UA positive    Leukocytes, UA 1+ Negative   Appearance     Odor        4. Other mixed anxiety  disorders Worried about 39 year old niece that had a stroke after brain surgery for a tumor. She is wheelchair bound and unable to care for herself. Near tears during interview. May use Trazodone at bedtime and recommend counseling. Recheck prn.  5. Urinary tract infection without hematuria, site unspecified Some increase in frequency and urgency the past week. UA in the ER showed pyuria. Will get C&S and start antibiotic until report isolates specific pathogen. Given antispasmodic (Levsin) and encouraged to increase water/fluid intake. - POCT Urinalysis Dipstick - Urine Culture - hyoscyamine (LEVSIN, ANASPAZ) 0.125 MG tablet; Take 1 tablet (0.125 mg total) by mouth every 4 (four) hours as needed.  Dispense: 30 tablet; Refill: 0 - nitrofurantoin, macrocrystal-monohydrate, (MACROBID) 100 MG capsule; Take 1 capsule (100 mg total) by mouth 2 (two) times daily.  Dispense: 20 capsule; Refill: [place], [place]  [facility]

## 2018-04-23 LAB — URINE CULTURE

## 2018-04-25 ENCOUNTER — Telehealth: Payer: Self-pay

## 2018-04-25 NOTE — Telephone Encounter (Signed)
-----   Message from Margo Common, [place] sent at 04/25/2018  8:04 AM EDT ----- Culture isolated E.coli bacteria in urine. It is sensitive to the antibiotic given. Finish all the antibiotic and recheck if symptoms return or remain in a week.

## 2018-04-25 NOTE — Telephone Encounter (Signed)
Tried calling; no answer.   Thanks,   -Layah Skousen  

## 2018-04-27 NOTE — Telephone Encounter (Signed)
LMTCB 04/27/2018   Thanks,   -Saren Corkern 

## 2018-04-28 NOTE — Telephone Encounter (Signed)
Pt advised.   Thanks,   -Geoffry Bannister  

## 2018-04-29 ENCOUNTER — Telehealth: Payer: Self-pay | Admitting: Family Medicine

## 2018-04-29 DIAGNOSIS — M5416 Radiculopathy, lumbar region: Secondary | ICD-10-CM

## 2018-04-29 NOTE — Telephone Encounter (Signed)
Referral to ortho spine made since she had mentioned it on visit with him on 04/21/18 but no order placed.

## 2018-04-29 NOTE — Telephone Encounter (Signed)
Pt called asking that we make her an appt for her legs hurting.  She said she had talked to [facility] about it in the past.  She said she ask about this yesterday when she talked to the nurse about her lab results.  CB#  [phone]      Thanks Con Memos

## 2018-04-29 NOTE — Telephone Encounter (Signed)
Patient advised as directed below.  Thanks,  -Joseline 

## 2018-04-29 NOTE — Telephone Encounter (Signed)
Please review for [facility].  Pt would like a referral to Orthopedics.  Is it okay to refer?  Thanks,   -Mickel Baas

## 2018-05-12 DIAGNOSIS — M160 Bilateral primary osteoarthritis of hip: Secondary | ICD-10-CM | POA: Diagnosis not present

## 2018-05-12 DIAGNOSIS — M48061 Spinal stenosis, lumbar region without neurogenic claudication: Secondary | ICD-10-CM | POA: Diagnosis not present

## 2018-05-12 DIAGNOSIS — M5136 Other intervertebral disc degeneration, lumbar region: Secondary | ICD-10-CM | POA: Diagnosis not present

## 2018-06-04 ENCOUNTER — Other Ambulatory Visit: Payer: Self-pay | Admitting: Family Medicine

## 2018-06-04 DIAGNOSIS — M51369 Other intervertebral disc degeneration, lumbar region without mention of lumbar back pain or lower extremity pain: Secondary | ICD-10-CM

## 2018-06-04 DIAGNOSIS — M5136 Other intervertebral disc degeneration, lumbar region: Secondary | ICD-10-CM

## 2018-06-06 NOTE — Telephone Encounter (Signed)
See refill request.

## 2018-06-08 ENCOUNTER — Telehealth: Payer: Self-pay

## 2018-06-08 NOTE — Telephone Encounter (Signed)
Patient can use tylenol. She needs to be see for this. With her GI history I do not feel comfortable giving her any NSAIDs without further evaluation.

## 2018-06-08 NOTE — Telephone Encounter (Signed)
Patient had called office with complaints of leg pain and requesting a prescription for pain medicine. She states that this is a chronic issue for the past 2years and states that she has bone spurs in the right leg near her joint. Patient states that she is a patient of Simona Huh, I informed her that Simona Huh was out of the office she asked that I forward message to another PA to review so she can have something for tonight. Please review, pharmacy [facility] [address] [address]

## 2018-06-08 NOTE — Telephone Encounter (Signed)
LMTCB  Thanks,  -Joseline 

## 2018-06-09 NOTE — Telephone Encounter (Signed)
lmtcb

## 2018-06-10 ENCOUNTER — Other Ambulatory Visit: Payer: Self-pay | Admitting: Family Medicine

## 2018-06-10 DIAGNOSIS — M5136 Other intervertebral disc degeneration, lumbar region: Secondary | ICD-10-CM

## 2018-06-10 MED ORDER — MELOXICAM 15 MG PO TABS: 15.0000 mg | ORAL_TABLET | Freq: Every day | ORAL | 0 refills | Status: DC

## 2018-06-10 NOTE — Telephone Encounter (Signed)
Since still taking the Zantac BID and no further stomach discomfort, will refill Meloxicam and recheck prn.

## 2018-06-10 NOTE — Telephone Encounter (Signed)
Advised patient as directed below. Per patient Simona Huh is her provider and he is the one that always sends her medication and wants this to be run by him and let him make the decision.

## 2018-06-10 NOTE — Telephone Encounter (Signed)
Did she see Dr. Sharyne Richters regarding the leg pain? Is she having any more abdominal pain and is she still taking the Zantac BID? With her history of abdominal issues, we don't want to trigger an ulcer or bleeding. May have to try Tramadol to limit irritation to her stomach.

## 2018-06-10 NOTE — Telephone Encounter (Signed)
LMTCB 06/10/2018  Thanks,   -Mickel Baas

## 2018-06-10 NOTE — Telephone Encounter (Signed)
Patient reports that yes she did see Dr.Dimmig and that she is taking Zantac and that her stomach doesn't hurt anymore. She states that she is willing to try Tramadol. [facility] [address]

## 2018-06-10 NOTE — Telephone Encounter (Signed)
Spoke with pt.  She states her stomach is okay now and is still taking Zantac.  She would like to try Meloxicam instead and keep taking the Zantac.  [facility] [address]   Thanks,   -Mickel Baas

## 2018-06-17 ENCOUNTER — Other Ambulatory Visit: Payer: Self-pay | Admitting: Family Medicine

## 2018-06-17 DIAGNOSIS — Z1231 Encounter for screening mammogram for malignant neoplasm of breast: Secondary | ICD-10-CM

## 2018-07-04 ENCOUNTER — Ambulatory Visit
Admission: RE | Admit: 2018-07-04 | Discharge: 2018-07-04 | Disposition: A | Discharge status: Home / Self Care | Payer: BLUE CROSS/BLUE SHIELD | Source: Ambulatory Visit | Attending: Family Medicine | Admitting: Family Medicine

## 2018-07-04 DIAGNOSIS — Z1231 Encounter for screening mammogram for malignant neoplasm of breast: Secondary | ICD-10-CM | POA: Diagnosis not present

## 2018-07-05 ENCOUNTER — Telehealth: Payer: Self-pay

## 2018-07-05 NOTE — Telephone Encounter (Signed)
-----   Message from Margo Common, [place] sent at 07/05/2018  8:20 AM EDT ----- Normal mammogram. Repeat in a year.

## 2018-07-05 NOTE — Telephone Encounter (Signed)
LMTCB 07/05/2018  Thanks,   -Mickel Baas

## 2018-07-06 NOTE — Telephone Encounter (Signed)
Pt advised.   Thanks,   -Dreya Buhrman  

## 2019-03-07 ENCOUNTER — Other Ambulatory Visit: Payer: Self-pay | Admitting: Family Medicine

## 2019-03-07 DIAGNOSIS — I10 Essential (primary) hypertension: Secondary | ICD-10-CM

## 2019-04-28 ENCOUNTER — Encounter: Payer: Self-pay | Admitting: Family Medicine

## 2019-04-28 ENCOUNTER — Other Ambulatory Visit: Payer: Self-pay

## 2019-04-28 ENCOUNTER — Ambulatory Visit: Payer: BLUE CROSS/BLUE SHIELD | Admitting: Family Medicine

## 2019-04-28 VITALS — BP 124/76 | HR 74 | Temp 98.3°F | Resp 16 | Wt 233.0 lb

## 2019-04-28 DIAGNOSIS — M25552 Pain in left hip: Secondary | ICD-10-CM | POA: Diagnosis not present

## 2019-04-28 MED ORDER — ETODOLAC 500 MG PO TABS: 500.0000 mg | ORAL_TABLET | Freq: Two times a day (BID) | ORAL | 3 refills | Status: DC

## 2019-04-28 NOTE — Progress Notes (Signed)
Patient: Janet Walters Female    DOB: 1960/12/06   58 y.o.   MRN: [id number] Visit Date: 04/28/2019  Today's Provider: Vernie Murders, PA   Chief Complaint  Patient presents with  . Leg Pain   Subjective:    HPI  Patient reports that she is having persistently leg pain that seems to be worsening. She reports that this is a chronic issue that she has had, and she was referred to ortho for this. She reports that the treatment at ortho did not help her (strengthing exercises, taking OTC anti inflammatory meds, etc).   Past Medical History:  Diagnosis Date  . Back pain, chronic   . DDD (degenerative disc disease), lumbar   . Genital herpes   . GERD (gastroesophageal reflux disease)   . Hypertension   . Insomnia    Past Surgical History:  Procedure Laterality Date  . COLONOSCOPY    . COLONOSCOPY WITH PROPOFOL N/A 09/18/2016   Procedure: COLONOSCOPY WITH PROPOFOL;  Surgeon: Manya Silvas, MD;  [facility] ENDOSCOPY;  Service: Endoscopy;  Laterality: N/A;   Family History  Problem Relation Age of Onset  . Breast cancer Paternal Aunt   . Breast cancer Paternal Aunt   . Breast cancer Paternal Aunt    No Known Allergies  Current Outpatient Medications:  .  hyoscyamine (LEVSIN, ANASPAZ) 0.125 MG tablet, Take 1 tablet (0.125 mg total) by mouth every 4 (four) hours as needed., Disp: 30 tablet, Rfl: 0 .  lisinopril-hydrochlorothiazide (ZESTORETIC) 20-25 MG tablet, TAKE 1 TABLET BY MOUTH EVERY [place], Disp: 30 tablet, Rfl: 8 .  sucralfate (CARAFATE) 1 g tablet, Take 1 tablet (1 g total) by mouth 4 (four) times daily., Disp: 120 tablet, Rfl: 1 .  traZODone (DESYREL) 50 MG tablet, Take by mouth., Disp: , Rfl:  .  loperamide (IMODIUM A-D) 2 MG tablet, Take 2 tablets (4 mg total) by mouth 4 (four) times daily as needed for diarrhea or loose stools. (Patient not taking: Reported on 04/28/2019), Disp: 30 tablet, Rfl: 0 .  meloxicam (MOBIC) 15 MG tablet, Take 1 tablet (15 mg total) by  mouth daily. (Patient not taking: Reported on 04/28/2019), Disp: 30 tablet, Rfl: 0 .  nitrofurantoin, macrocrystal-monohydrate, (MACROBID) 100 MG capsule, Take 1 capsule (100 mg total) by mouth 2 (two) times daily. (Patient not taking: Reported on 04/28/2019), Disp: 20 capsule, Rfl: 0  Review of Systems  Constitutional: Negative for activity change, fatigue and fever.  Respiratory: Negative for cough and shortness of breath.   Cardiovascular: Negative for leg swelling.  Musculoskeletal: Positive for arthralgias.  Neurological: Negative for dizziness.  Psychiatric/Behavioral: Negative for agitation, self-injury and sleep disturbance. The patient is not nervous/anxious and is not hyperactive.    Social History   Tobacco Use  . Smoking status: Never Smoker  . Smokeless tobacco: Never Used  Substance Use Topics  . Alcohol use: No    Alcohol/week: 0.0 standard drinks     Objective:   BP 124/76   Pulse 74   Temp 98.3 F (36.8 C)   Resp 16   Wt 233 lb (105.7 kg)   SpO2 100%   BMI 37.61 kg/m  Vitals:   04/28/19 1613  BP: 124/76  Pulse: 74  Resp: 16  Temp: 98.3 F (36.8 C)  SpO2: 100%  Weight: 233 lb (105.7 kg)   Physical Exam Constitutional:      General: She is not in acute distress.    Appearance: She is well-developed.  HENT:     Head: Normocephalic and atraumatic.     Right Ear: Hearing normal.     Left Ear: Hearing normal.     Nose: Nose normal.  Eyes:     General: Lids are normal. No scleral icterus.       Right eye: No discharge.        Left eye: No discharge.     Conjunctiva/sclera: Conjunctivae normal.  Neck:     Musculoskeletal: Neck supple.  Cardiovascular:     Rate and Rhythm: Normal rate.  Pulmonary:     Effort: Pulmonary effort is normal. No respiratory distress.  Musculoskeletal:     Comments: Unable to rotate hips internally or externally very much bilaterally. Pain occurs in the left hip with internal rotation. Unable to get ankles up on knees  bilaterally. Good pulses. No crepitus in knees.  Skin:    Findings: No lesion or rash.  Neurological:     Mental Status: She is alert and oriented to person, place, and time.  Psychiatric:        Speech: Speech normal.        Behavior: Behavior normal.        Thought Content: Thought content normal.       Assessment & Plan    1. Left hip pain Long term discomfort in left hip. History of degenerative changes on right hip x-ray in 2016. States she has been evaluated by an orthopedist but did not feel the physical therapy and Mobic used August 2019 helped much. Requests a different NSAID before proceeding with follow x-rays and recheck with orthopedist. May apply moist heat and recheck prn. - etodolac (LODINE) 500 MG tablet; Take 1 tablet (500 mg total) by mouth 2 (two) times daily.  Dispense: 60 tablet; Refill: [place]

## 2019-05-03 ENCOUNTER — Telehealth: Payer: Self-pay | Admitting: Family Medicine

## 2019-05-03 NOTE — Telephone Encounter (Signed)
As discussed during the appointment, it is time to get the x-rays done and consider the orthopedic referral for possible cortisone injection.

## 2019-05-03 NOTE — Telephone Encounter (Signed)
Please review

## 2019-05-03 NOTE — Telephone Encounter (Signed)
etodolac (LODINE) 500 MG tablet  Is not helping legs and making pt feel nauseous.  Pt is asking if something else could be called in for her?  Please advise.  Thanks, [employer]

## 2019-05-04 NOTE — Telephone Encounter (Signed)
No answer

## 2019-05-05 ENCOUNTER — Other Ambulatory Visit: Payer: Self-pay | Admitting: Family Medicine

## 2019-05-05 DIAGNOSIS — M25552 Pain in left hip: Secondary | ICD-10-CM

## 2019-05-05 NOTE — Telephone Encounter (Signed)
I tried calling patient on both the home and cell phone. No answer on either line; the phone just kept ringing. Will try calling back at a later time.

## 2019-05-05 NOTE — Telephone Encounter (Signed)
Patient advised as directed below. She agreed to the xray and referral to the Orthopedic. She reports that she does not want to go to [facility].

## 2019-05-05 NOTE — Telephone Encounter (Signed)
Placed order in chart for x-ray of the left hip and orthopedic referral.

## 2019-05-09 NOTE — Telephone Encounter (Signed)
Janet Walters has placed the orders for this and the patient requested that her referral not be to [facility] Thanks

## 2019-05-09 NOTE — Telephone Encounter (Signed)
LMTCB ED 

## 2019-05-19 ENCOUNTER — Telehealth: Payer: Self-pay | Admitting: Family Medicine

## 2019-05-26 DIAGNOSIS — M25551 Pain in right hip: Secondary | ICD-10-CM | POA: Diagnosis not present

## 2019-05-26 DIAGNOSIS — M160 Bilateral primary osteoarthritis of hip: Secondary | ICD-10-CM | POA: Diagnosis not present

## 2019-06-02 ENCOUNTER — Other Ambulatory Visit: Payer: Self-pay | Admitting: Family Medicine

## 2019-06-02 DIAGNOSIS — Z1231 Encounter for screening mammogram for malignant neoplasm of breast: Secondary | ICD-10-CM

## 2019-07-06 ENCOUNTER — Ambulatory Visit
Admission: RE | Admit: 2019-07-06 | Discharge: 2019-07-06 | Disposition: A | Discharge status: Home / Self Care | Payer: BC Managed Care – PPO | Source: Ambulatory Visit | Attending: Family Medicine | Admitting: Family Medicine

## 2019-07-06 DIAGNOSIS — Z1231 Encounter for screening mammogram for malignant neoplasm of breast: Secondary | ICD-10-CM | POA: Diagnosis not present

## 2019-07-07 ENCOUNTER — Telehealth: Payer: Self-pay

## 2019-07-07 NOTE — Telephone Encounter (Signed)
-----   Message from Margo Common, [place] sent at 07/07/2019  1:42 PM EDT ----- No signs of malignancy on mammograms. Radiologist recommends repeat mammograms annually.

## 2019-07-07 NOTE — Telephone Encounter (Signed)
Patient notified of results.

## 2019-07-07 NOTE — Telephone Encounter (Signed)
Tried calling patient, no answer. Unable to leave vm.

## 2019-07-18 DIAGNOSIS — M1611 Unilateral primary osteoarthritis, right hip: Secondary | ICD-10-CM | POA: Diagnosis not present

## 2019-07-18 DIAGNOSIS — M5416 Radiculopathy, lumbar region: Secondary | ICD-10-CM | POA: Diagnosis not present

## 2019-10-05 DIAGNOSIS — M25551 Pain in right hip: Secondary | ICD-10-CM | POA: Diagnosis not present

## 2019-10-05 DIAGNOSIS — M1611 Unilateral primary osteoarthritis, right hip: Secondary | ICD-10-CM | POA: Diagnosis not present

## 2019-10-26 ENCOUNTER — Other Ambulatory Visit: Payer: Self-pay | Admitting: Family Medicine

## 2019-10-26 DIAGNOSIS — M25552 Pain in left hip: Secondary | ICD-10-CM

## 2019-10-26 NOTE — Telephone Encounter (Signed)
Patinet was seen by [facility] 05/2019 surgery recommended. Has she followed up ? Any other NSAIDS  ibuprofen/ aleve etc - any recent steroid injections ? - I see Meloxicam listed on her medication list too. She is over due for kidney   She should also come to the office for labs CMP/CBC with these medications- need to check kidney function.  If no other NSAIDS or prednisone I will send in a months supply of Lodine and please place lab orders. No further refill without labs. She should be advised of risk for cardiac concerns and gastrointestinal bleeding risk with these medications. AVOID other NSAID's. Tylenol for pain.   See not e below [facility] 05/2019 ICD-10-CM  1. Right hip pain M25.551  2. Primary osteoarthritis of right hip M16.11  3. Primary osteoarthritis of left hip M16.12   SECONDARY CONDITIONS THAT INFLUENCE TREATMENT AND DECISION-MAKING: Non-smoker, morbid obesity, hypertension, GERD, lumbar degenerative disc disease, fibroid  Plan:   I have discussed the nature of her current subjective complaints, clinical examination, test results and have reviewed treatment options. The plan is to do the following;  - The patient has history and physical suspicious for pain related to hip arthritis. Her x-rays show significant degenerative changes to the right hip joint including protrusio acetabuli. She is more symptomatic on the right compared to left. I discussed other treatment options for hip arthritis with home exercise, weight management, medication, steroid injection but due to the extent of protrusion into the acetabulum, I recommended surgical consultation prior to considering any further conservative treatments.

## 2019-10-26 NOTE — Telephone Encounter (Signed)
No answer or voice mail.  Will need more information from patient before consideration of refill.

## 2019-10-26 NOTE — Telephone Encounter (Signed)
Sharyn Lull, due to the patient's age I do not usually refill these without a provider checking it first.  Please advise or refill Thanks

## 2019-10-26 NOTE — Telephone Encounter (Signed)
Requested medication (s) are due for refill today:  Yes  Requested medication (s) are on the active medication list:   Yes  Future visit scheduled:   No     Last ordered: 04/28/2019  #60  3 refills          Requested Prescriptions  Pending Prescriptions Disp Refills   etodolac (LODINE) 500 MG tablet [Pharmacy Med Name: ETODOLAC 500 MG TABLET] 60 tablet 3    Sig: TAKE 1 TABLET BY MOUTH TWICE A [place]      Analgesics:  NSAIDS Failed - 10/26/2019  1:55 AM      Failed - Cr in normal range and within 360 days    Creatinine, Ser  Date Value Ref Range Status  04/17/2018 0.67 0.44 - 1.00 mg/dL Final          Failed - HGB in normal range and within 360 days    Hemoglobin  Date Value Ref Range Status  04/17/2018 14.3 12.0 - 16.0 g/dL Final  05/31/2017 13.3 11.1 - 15.9 g/dL Final          Passed - Patient is not pregnant      Passed - Valid encounter within last 12 months    Recent Outpatient Visits           6 months ago Left hip pain   [place], Vickki Muff, [place]   1 year ago Upper abdominal pain   [facility], Vickki Muff, [place]   1 year ago DDD (degenerative disc disease), lumbar   [facility], Vickki Muff, [place]   2 years ago Annual physical exam   [facility], Clearnce Sorrel, [place]   2 years ago Essential (primary) hypertension   [facility], [place], [place]

## 2019-11-01 ENCOUNTER — Telehealth: Payer: Self-pay | Admitting: Family Medicine

## 2019-11-20 ENCOUNTER — Other Ambulatory Visit: Payer: Self-pay

## 2019-11-20 ENCOUNTER — Ambulatory Visit (INDEPENDENT_AMBULATORY_CARE_PROVIDER_SITE_OTHER): Payer: BC Managed Care – PPO | Admitting: Family Medicine

## 2019-11-20 VITALS — BP 130/82 | HR 82 | Temp 97.1°F | Wt 237.0 lb

## 2019-11-20 DIAGNOSIS — M1611 Unilateral primary osteoarthritis, right hip: Secondary | ICD-10-CM | POA: Diagnosis not present

## 2019-11-20 MED ORDER — MELOXICAM 15 MG PO TABS: 15.0000 mg | ORAL_TABLET | Freq: Every day | ORAL | 0 refills | Status: DC

## 2019-11-20 NOTE — Progress Notes (Addendum)
Janet Walters  MRN: [id number] DOB: September 21, 1961  Subjective:  HPI   The patient is a 59 year old female who presents for follow up of her right hip pain.  She was referred to Dr Marry Guan who told her that her hip was not bad enough yet to proceed with surgery. The patient has been using Etodolac and Ibuprofen for pain.  She states that you once gave her Meloxicam and she feels this helped more than what she is taking now.  She reports that the hip pain has progressed to the point that she has pain that radiates down her leg and now her knees are bothering her from lack of exercise due to the pain.  Patient Active Problem List   Diagnosis Date Noted  . Insomnia 02/21/2016  . DDD (degenerative disc disease), lumbar 01/28/2016  . Leg pain, right 06/13/2015  . Abnormal blood sugar 12/18/2009  . Low back pain 12/27/2007  . Fibroid 09/24/2006  . Genital herpes 09/24/2006  . Acid reflux 09/24/2006  . Family history of cancer of digestive system 09/06/2006  . Family history of diabetes mellitus 09/06/2006  . Essential (primary) hypertension 11/11/2001    Past Medical History:  Diagnosis Date  . Back pain, chronic   . DDD (degenerative disc disease), lumbar   . Genital herpes   . GERD (gastroesophageal reflux disease)   . Hypertension   . Insomnia     Social History   Socioeconomic History  . Marital status: Single    Spouse name: Not on file  . Number of children: Not on file  . Years of education: Not on file  . Highest education level: Not on file  Occupational History  . Not on file  Tobacco Use  . Smoking status: Never Smoker  . Smokeless tobacco: Never Used  Substance and Sexual Activity  . Alcohol use: No    Alcohol/week: 0.0 standard drinks  . Drug use: No  . Sexual activity: Not on file  Other Topics Concern  . Not on file  Social History Narrative  . Not on file   Social Determinants of Health   Financial Resource Strain:   . Difficulty of Paying Living  Expenses: Not on file  Food Insecurity:   . Worried About Charity fundraiser in the Last Year: Not on file  . Ran Out of Food in the Last Year: Not on file  Transportation Needs:   . Lack of Transportation (Medical): Not on file  . Lack of Transportation (Non-Medical): Not on file  Physical Activity:   . Days of Exercise per Week: Not on file  . Minutes of Exercise per Session: Not on file  Stress:   . Feeling of Stress : Not on file  Social Connections:   . Frequency of Communication with Friends and Family: Not on file  . Frequency of Social Gatherings with Friends and Family: Not on file  . Attends Religious Services: Not on file  . Active Member of Clubs or Organizations: Not on file  . Attends Archivist Meetings: Not on file  . Marital Status: Not on file  Intimate Partner Violence:   . Fear of Current or Ex-Partner: Not on file  . Emotionally Abused: Not on file  . Physically Abused: Not on file  . Sexually Abused: Not on file    Outpatient Encounter Medications as of 11/20/2019  Medication Sig  . etodolac (LODINE) 500 MG tablet Take 1 tablet (500 mg total) by mouth  2 (two) times daily.  Marland Kitchen lisinopril-hydrochlorothiazide (ZESTORETIC) 20-25 MG tablet TAKE 1 TABLET BY MOUTH EVERY [place]  . loperamide (IMODIUM A-D) 2 MG tablet Take 2 tablets (4 mg total) by mouth 4 (four) times daily as needed for diarrhea or loose stools. (Patient not taking: Reported on 04/28/2019)  . sucralfate (CARAFATE) 1 g tablet Take 1 tablet (1 g total) by mouth 4 (four) times daily. (Patient not taking: Reported on 11/20/2019)  . traZODone (DESYREL) 50 MG tablet Take by mouth.  . [DISCONTINUED] hyoscyamine (LEVSIN, ANASPAZ) 0.125 MG tablet Take 1 tablet (0.125 mg total) by mouth every 4 (four) hours as needed.  . [DISCONTINUED] meloxicam (MOBIC) 15 MG tablet Take 1 tablet (15 mg total) by mouth daily. (Patient not taking: Reported on 04/28/2019)  . [DISCONTINUED] nitrofurantoin,  macrocrystal-monohydrate, (MACROBID) 100 MG capsule Take 1 capsule (100 mg total) by mouth 2 (two) times daily. (Patient not taking: Reported on 04/28/2019)   No facility-administered encounter medications on file as of 11/20/2019.    No Known Allergies  Review of Systems  Respiratory: Negative for cough and shortness of breath.   Cardiovascular: Negative for chest pain and palpitations.  Musculoskeletal: Positive for falls, joint pain and myalgias. Negative for back pain.  Neurological: Negative for dizziness and headaches.    Objective:  BP 130/82 (BP Location: Right Arm, Patient Position: Sitting, Cuff Size: Normal)   Pulse 82   Temp (!) 97.1 F (36.2 C) (Skin)   Wt 237 lb (107.5 kg)   SpO2 96%   BMI 38.25 kg/m   Physical Exam  Constitutional: She is oriented to person, place, and time and well-developed, well-nourished, and in no distress.  HENT:  Head: Normocephalic.  Eyes: Conjunctivae are normal.  Pulmonary/Chest: Effort normal.  Abdominal: Soft.  Musculoskeletal:        General: Tenderness present. Normal range of motion.     Cervical back: Neck supple.     Comments: Very stiff and tender in the right hip after sitting.  Good pulses. Unable put right heel on left knee. Not able to rotate either hip. Good pedal pulses.  Neurological: She is alert and oriented to person, place, and time.  Skin: No rash noted.  Psychiatric: Mood, affect and judgment normal.    Assessment and Plan :   1. Primary osteoarthritis of right hip Degenerative changes in the right hip documented on x-rays of 01-23-15. Wants to try the Meloxicam 15 mg qd again. May apply moist heat and consider need to recheck with orthopedist if no improvement. Should stop using the Ibuprofen and Etodolac.

## 2019-12-13 ENCOUNTER — Other Ambulatory Visit: Payer: Self-pay | Admitting: Family Medicine

## 2019-12-13 DIAGNOSIS — M1611 Unilateral primary osteoarthritis, right hip: Secondary | ICD-10-CM

## 2020-01-11 ENCOUNTER — Other Ambulatory Visit: Payer: Self-pay | Admitting: Family Medicine

## 2020-01-11 DIAGNOSIS — M1611 Unilateral primary osteoarthritis, right hip: Secondary | ICD-10-CM

## 2020-01-14 ENCOUNTER — Other Ambulatory Visit: Payer: Self-pay | Admitting: Family Medicine

## 2020-01-14 DIAGNOSIS — I10 Essential (primary) hypertension: Secondary | ICD-10-CM

## 2020-01-14 NOTE — Telephone Encounter (Signed)
Requested Prescriptions  Pending Prescriptions Disp Refills  . lisinopril-hydrochlorothiazide (ZESTORETIC) 20-25 MG tablet [Pharmacy Med Name: LISINOPRIL-HCTZ 20-25 MG TAB] 30 tablet 8    Sig: TAKE 1 TABLET BY MOUTH EVERY [place]     Cardiovascular:  ACEI + Diuretic Combos Failed - 01/14/2020  9:29 AM      Failed - Na in normal range and within 180 days    Sodium  Date Value Ref Range Status  04/17/2018 140 135 - 145 mmol/L Final  05/31/2017 143 134 - 144 mmol/L Final         Failed - K in normal range and within 180 days    Potassium  Date Value Ref Range Status  04/17/2018 3.5 3.5 - 5.1 mmol/L Final         Failed - Cr in normal range and within 180 days    Creatinine, Ser  Date Value Ref Range Status  04/17/2018 0.67 0.44 - 1.00 mg/dL Final         Failed - Ca in normal range and within 180 days    Calcium  Date Value Ref Range Status  04/17/2018 9.5 8.9 - 10.3 mg/dL Final         Passed - Patient is not pregnant      Passed - Last BP in normal range    BP Readings from Last 1 Encounters:  11/20/19 130/82         Passed - Valid encounter within last 6 months    Recent Outpatient Visits          1 month ago Primary osteoarthritis of right hip   [place], [place], [place]   8 months ago Left hip pain   [place], [place]   1 year ago Upper abdominal pain   [facility], Vickki Muff, [place]   2 years ago DDD (degenerative disc disease), lumbar   [facility], Vickki Muff, [place]   2 years ago Annual physical exam   [facility] [place], [place], [place]

## 2020-01-30 ENCOUNTER — Encounter: Payer: Self-pay | Admitting: Family Medicine

## 2020-07-02 ENCOUNTER — Other Ambulatory Visit: Payer: Self-pay | Admitting: Family Medicine

## 2020-07-05 ENCOUNTER — Telehealth: Payer: Self-pay

## 2020-07-05 NOTE — Telephone Encounter (Unsigned)
Copied from [place] [id number]. Topic: General - Inquiry >> Jul 04, 2020  5:16 PM Greggory Keen D wrote: Reason for CRM: Pt called saying when she called to get an appt for her mammogram they told her Simona Huh would need to order it for her.  Pt's CB#  [phone]

## 2020-07-08 ENCOUNTER — Other Ambulatory Visit: Payer: Self-pay | Admitting: Family Medicine

## 2020-07-08 DIAGNOSIS — Z1231 Encounter for screening mammogram for malignant neoplasm of breast: Secondary | ICD-10-CM

## 2020-07-08 NOTE — Telephone Encounter (Signed)
I tried to place orders but am unable to do so

## 2020-07-08 NOTE — Telephone Encounter (Signed)
Placed order

## 2020-07-18 NOTE — Telephone Encounter (Signed)
PT calling back to F/UP on her mammogram request / please advise

## 2020-08-12 ENCOUNTER — Other Ambulatory Visit: Payer: Self-pay

## 2020-08-12 DIAGNOSIS — Z1231 Encounter for screening mammogram for malignant neoplasm of breast: Secondary | ICD-10-CM

## 2020-08-12 DIAGNOSIS — M25552 Pain in left hip: Secondary | ICD-10-CM

## 2021-01-13 ENCOUNTER — Ambulatory Visit: Payer: BC Managed Care – PPO | Admitting: Family Medicine

## 2021-01-13 ENCOUNTER — Other Ambulatory Visit: Payer: Self-pay | Admitting: Family Medicine

## 2021-01-13 DIAGNOSIS — I10 Essential (primary) hypertension: Secondary | ICD-10-CM

## 2021-01-13 MED ORDER — LISINOPRIL-HYDROCHLOROTHIAZIDE 20-25 MG PO TABS
1.0000 | ORAL_TABLET | Freq: Every day | ORAL | 0 refills | Status: DC
Start: 2021-01-13 — End: 2021-02-10

## 2021-01-13 NOTE — Telephone Encounter (Signed)
Medication: lisinopril-hydrochlorothiazide (ZESTORETIC) 20-25 MG tablet  Has the pt contacted their pharmacy? No/ pt had appt today and was called to [facility].  Pt needs 30 [address] to get her to the next available which was 5/02  [id number] [place] [place]  Please be advised refills may take up to 3 business days.  We ask that you follow up with your pharmacy.

## 2021-02-10 ENCOUNTER — Ambulatory Visit: Payer: BC Managed Care – PPO | Admitting: Family Medicine

## 2021-02-10 ENCOUNTER — Other Ambulatory Visit: Payer: Self-pay

## 2021-02-10 ENCOUNTER — Encounter: Payer: Self-pay | Admitting: Family Medicine

## 2021-02-10 VITALS — BP 129/78 | HR 61 | Temp 98.2°F | Wt 241.0 lb

## 2021-02-10 DIAGNOSIS — I10 Essential (primary) hypertension: Secondary | ICD-10-CM | POA: Diagnosis not present

## 2021-02-10 DIAGNOSIS — M1611 Unilateral primary osteoarthritis, right hip: Secondary | ICD-10-CM

## 2021-02-10 MED ORDER — MELOXICAM 15 MG PO TABS: 1.0000 | ORAL_TABLET | Freq: Every day | ORAL | 2 refills | Status: DC

## 2021-02-10 MED ORDER — LISINOPRIL-HYDROCHLOROTHIAZIDE 20-25 MG PO TABS: 1.0000 | ORAL_TABLET | Freq: Every day | ORAL | 3 refills | Status: DC

## 2021-02-10 NOTE — Progress Notes (Signed)
Acute Office Visit  Subjective:    Patient ID: Janet Walters, female    DOB: 1960/11/13, 60 y.o.   MRN: [id number]  No chief complaint on file.   HPI Patient is in today for bilateral leg pain.  She states she has had pain for years.  She states her hips and knees give her trouble and she thinks she has arthritis.  She is not currently taking anything.  She states she has had no know specific trauma and no surgery to either leg.  She reports she has had injection in her knee about 2 years ago.   Past Medical History:  Diagnosis Date  . Back pain, chronic   . DDD (degenerative disc disease), lumbar   . Genital herpes   . GERD (gastroesophageal reflux disease)   . Hypertension   . Insomnia     Past Surgical History:  Procedure Laterality Date  . COLONOSCOPY    . COLONOSCOPY WITH PROPOFOL N/A 09/18/2016   Procedure: COLONOSCOPY WITH PROPOFOL;  Surgeon: Manya Silvas, MD;  Location: [facility] ENDOSCOPY;  Service: Endoscopy;  Laterality: N/A;    Family History  Problem Relation Age of Onset  . Breast cancer Paternal Aunt   . Breast cancer Paternal Aunt   . Breast cancer Paternal Aunt     Social History   Socioeconomic History  . Marital status: Single    Spouse name: Not on file  . Number of children: Not on file  . Years of education: Not on file  . Highest education level: Not on file  Occupational History  . Not on file  Tobacco Use  . Smoking status: Never Smoker  . Smokeless tobacco: Never Used  Vaping Use  . Vaping Use: Never used  Substance and Sexual Activity  . Alcohol use: No    Alcohol/week: 0.0 standard drinks  . Drug use: No  . Sexual activity: Not on file  Other Topics Concern  . Not on file  Social History Narrative  . Not on file   Social Determinants of Health   Financial Resource Strain: Not on file  Food Insecurity: Not on file  Transportation Needs: Not on file  Physical Activity: Not on file  Stress: Not on file  Social Connections: Not  on file  Intimate Partner Violence: Not on file    Outpatient Medications Prior to Visit  Medication Sig Dispense Refill  . lisinopril-hydrochlorothiazide (ZESTORETIC) 20-25 MG tablet Take 1 tablet by mouth daily. 30 tablet 0  . etodolac (LODINE) 500 MG tablet Take 1 tablet (500 mg total) by mouth 2 (two) times daily. 60 tablet 3  . loperamide (IMODIUM A-D) 2 MG tablet Take 2 tablets (4 mg total) by mouth 4 (four) times daily as needed for diarrhea or loose stools. 30 tablet 0  . meloxicam (MOBIC) 15 MG tablet TAKE 1 TABLET BY MOUTH EVERY [place] 90 tablet 2  . sucralfate (CARAFATE) 1 g tablet Take 1 tablet (1 g total) by mouth 4 (four) times daily. 120 tablet 1  . traZODone (DESYREL) 50 MG tablet Take by mouth.     No facility-administered medications prior to visit.    No Known Allergies  Review of Systems  Musculoskeletal: Positive for arthralgias and myalgias.       Objective:    Physical Exam Constitutional:      General: She is not in acute distress.    Appearance: She is well-developed.  HENT:     Head: Normocephalic and atraumatic.  Right Ear: Hearing normal.     Left Ear: Hearing normal.     Nose: Nose normal.  Eyes:     General: Lids are normal. No scleral icterus.       Right eye: No discharge.        Left eye: No discharge.     Conjunctiva/sclera: Conjunctivae normal.  Cardiovascular:     Rate and Rhythm: Normal rate and regular rhythm.     Heart sounds: Normal heart sounds.  Pulmonary:     Effort: Pulmonary effort is normal. No respiratory distress.     Breath sounds: Normal breath sounds.  Abdominal:     General: Bowel sounds are normal.     Palpations: Abdomen is soft.  Musculoskeletal:     Cervical back: Neck supple.     Comments: No swelling. Stiffness of hips. Unable to put ankles on the opposite knees.   Skin:    Findings: No lesion or rash.  Neurological:     Mental Status: She is alert and oriented to person, place, and time.  Psychiatric:         Speech: Speech normal.        Behavior: Behavior normal.        Thought Content: Thought content normal.     BP 129/78 (BP Location: Right Arm, Patient Position: Sitting, Cuff Size: Normal)   Pulse 61   Temp 98.2 F (36.8 C) (Oral)   Wt 241 lb (109.3 kg)   SpO2 100%   BMI 38.90 kg/m  Wt Readings from Last 3 Encounters:  02/10/21 241 lb (109.3 kg)  11/20/19 237 lb (107.5 kg)  04/28/19 233 lb (105.7 kg)    Health Maintenance Due  Topic Date Due  . COVID-19 Vaccine (1) Never done  . PAP SMEAR-Modifier  05/30/2019  . TETANUS/TDAP  12/19/2019    There are no preventive care reminders to display for this patient.   Lab Results  Component Value Date   TSH 2.100 05/31/2017   Lab Results  Component Value Date   WBC 11.5 (H) 04/17/2018   HGB 14.3 04/17/2018   HCT 42.4 04/17/2018   MCV 79.7 (L) 04/17/2018   PLT 242 04/17/2018   Lab Results  Component Value Date   NA 140 04/17/2018   K 3.5 04/17/2018   CO2 28 04/17/2018   GLUCOSE 102 (H) 04/17/2018   BUN 10 04/17/2018   CREATININE 0.67 04/17/2018   BILITOT 1.0 04/17/2018   ALKPHOS 80 04/17/2018   AST 21 04/17/2018   ALT 11 04/17/2018   PROT 7.6 04/17/2018   ALBUMIN 4.3 04/17/2018   CALCIUM 9.5 04/17/2018   ANIONGAP 10 04/17/2018   Lab Results  Component Value Date   CHOL 160 05/31/2017   Lab Results  Component Value Date   HDL 54 05/31/2017   Lab Results  Component Value Date   LDLCALC 87 05/31/2017   Lab Results  Component Value Date   TRIG 94 05/31/2017   Lab Results  Component Value Date   CHOLHDL 3.0 05/31/2017   Lab Results  Component Value Date   HGBA1C 5.8 (H) 05/31/2017       Assessment & Plan:   1. Primary osteoarthritis of right hip More stiffness and ache in hips since she ran out of the Meloxicam. Will refill and recommend moist heat applications and walking for exercise. - meloxicam (MOBIC) 15 MG tablet; Take 1 tablet (15 mg total) by mouth daily.  Dispense: 90 tablet;  Refill: 2 - CBC with  Differential/Platelet - Comprehensive metabolic panel  2. Essential (primary) hypertension Tolerating the Zestoretic 20-25 mg qd. Recheck labs and refill meds. - lisinopril-hydrochlorothiazide (ZESTORETIC) 20-25 MG tablet; Take 1 tablet by mouth daily.  Dispense: 90 tablet; Refill: 3 - CBC with Differential/Platelet - Comprehensive metabolic panel - TSH       Juluis Mire, CMA   I, Joann Kulpa, PA-C, have reviewed all documentation for this visit. The documentation on 02/10/21 for the exam, diagnosis, procedures, and orders are all accurate and complete.

## 2021-02-11 DIAGNOSIS — I10 Essential (primary) hypertension: Secondary | ICD-10-CM | POA: Diagnosis not present

## 2021-02-11 DIAGNOSIS — M1611 Unilateral primary osteoarthritis, right hip: Secondary | ICD-10-CM | POA: Diagnosis not present

## 2021-02-12 LAB — COMPREHENSIVE METABOLIC PANEL
ALT: 12 IU/L (ref 0–32)
AST: 24 IU/L (ref 0–40)
Albumin/Globulin Ratio: 1.9 (ref 1.2–2.2)
Albumin: 4.6 g/dL (ref 3.8–4.8)
Alkaline Phosphatase: 97 IU/L (ref 44–121)
BUN/Creatinine Ratio: 18 (ref 12–28)
BUN: 16 mg/dL (ref 8–27)
Bilirubin Total: 0.6 mg/dL (ref 0.0–1.2)
CO2: 19 mmol/L — ABNORMAL LOW (ref 20–29)
Calcium: 9.5 mg/dL (ref 8.7–10.3)
Chloride: 100 mmol/L (ref 96–106)
Creatinine, Ser: 0.91 mg/dL (ref 0.57–1.00)
Globulin, Total: 2.4 g/dL (ref 1.5–4.5)
Glucose: 87 mg/dL (ref 65–99)
Potassium: 3.6 mmol/L (ref 3.5–5.2)
Sodium: 138 mmol/L (ref 134–144)
Total Protein: 7 g/dL (ref 6.0–8.5)
eGFR: 72 mL/min/{1.73_m2} (ref >59)

## 2021-02-12 LAB — CBC WITH DIFFERENTIAL/PLATELET
Basophils Absolute: 0 10*3/uL (ref 0.0–0.2)
Basos: 1 %
EOS (ABSOLUTE): 0.2 10*3/uL (ref 0.0–0.4)
Eos: 3 %
Hematocrit: 40.3 % (ref 34.0–46.6)
Hemoglobin: 13 g/dL (ref 11.1–15.9)
Immature Grans (Abs): 0 10*3/uL (ref 0.0–0.1)
Immature Granulocytes: 0 %
Lymphocytes Absolute: 2.2 10*3/uL (ref 0.7–3.1)
Lymphs: 34 %
MCH: 25.7 pg — ABNORMAL LOW (ref 26.6–33.0)
MCHC: 32.3 g/dL (ref 31.5–35.7)
MCV: 80 fL (ref 79–97)
Monocytes Absolute: 0.5 10*3/uL (ref 0.1–0.9)
Monocytes: 8 %
Neutrophils Absolute: 3.5 10*3/uL (ref 1.4–7.0)
Neutrophils: 54 %
Platelets: 293 10*3/uL (ref 150–450)
RBC: 5.05 x10E6/uL (ref 3.77–5.28)
RDW: 12.6 % (ref 11.7–15.4)
WBC: 6.4 10*3/uL (ref 3.4–10.8)

## 2021-02-12 LAB — TSH: TSH: 1.81 u[IU]/mL (ref 0.450–4.500)

## 2021-02-26 ENCOUNTER — Telehealth: Payer: Self-pay

## 2021-02-26 NOTE — Telephone Encounter (Signed)
LMTCB, [place] Triage Nurse may give patient results returned call to patient and got no answer.  If patient calls back P[facility] can give her lab results.

## 2021-02-26 NOTE — Telephone Encounter (Signed)
Copied from [place] [id number]. Topic: General - Other >> Feb 26, 2021  9:20 AM Tessa Lerner A wrote: Reason for CRM: Patient has returned missed call from practice   Agent saw no open notes or encounters at the time of returned call  Please contact again when possible

## 2021-02-27 NOTE — Telephone Encounter (Signed)
Pt returned the call and was given the lab result message from [place] from 02/13/2021.   She is going to call back to make the 6 month BP recheck appt.   No questions from pt.

## 2021-03-20 DIAGNOSIS — M25551 Pain in right hip: Secondary | ICD-10-CM | POA: Diagnosis not present

## 2021-03-20 DIAGNOSIS — M47816 Spondylosis without myelopathy or radiculopathy, lumbar region: Secondary | ICD-10-CM | POA: Diagnosis not present

## 2021-03-20 DIAGNOSIS — M5137 Other intervertebral disc degeneration, lumbosacral region: Secondary | ICD-10-CM | POA: Diagnosis not present

## 2021-03-20 DIAGNOSIS — M25552 Pain in left hip: Secondary | ICD-10-CM | POA: Diagnosis not present

## 2021-03-20 DIAGNOSIS — M160 Bilateral primary osteoarthritis of hip: Secondary | ICD-10-CM | POA: Diagnosis not present

## 2021-04-01 ENCOUNTER — Ambulatory Visit: Payer: BC Managed Care – PPO | Admitting: Nurse Practitioner

## 2021-04-01 DIAGNOSIS — S134XXA Sprain of ligaments of cervical spine, initial encounter: Secondary | ICD-10-CM | POA: Diagnosis not present

## 2021-04-01 DIAGNOSIS — S4992XA Unspecified injury of left shoulder and upper arm, initial encounter: Secondary | ICD-10-CM | POA: Diagnosis not present

## 2021-04-02 DIAGNOSIS — M25551 Pain in right hip: Secondary | ICD-10-CM | POA: Diagnosis not present

## 2021-04-02 DIAGNOSIS — M47816 Spondylosis without myelopathy or radiculopathy, lumbar region: Secondary | ICD-10-CM | POA: Diagnosis not present

## 2021-04-02 DIAGNOSIS — M25552 Pain in left hip: Secondary | ICD-10-CM | POA: Diagnosis not present

## 2021-04-10 ENCOUNTER — Other Ambulatory Visit: Payer: Self-pay

## 2021-04-10 NOTE — Telephone Encounter (Signed)
Copied from [place] [id number]. Topic: General - Other >> Apr 09, 2021  4:49 PM Tessa Lerner A wrote: Reason for CRM: Patient has called to request a prescription for Baclofen 10 MG  Patient was directed by their preferred pharmacy staff to get this prescription approved by their PCP   Please contact to further advise further if needed

## 2021-04-11 DIAGNOSIS — M25551 Pain in right hip: Secondary | ICD-10-CM | POA: Diagnosis not present

## 2021-04-11 DIAGNOSIS — M47816 Spondylosis without myelopathy or radiculopathy, lumbar region: Secondary | ICD-10-CM | POA: Diagnosis not present

## 2021-04-11 DIAGNOSIS — M25552 Pain in left hip: Secondary | ICD-10-CM | POA: Diagnosis not present

## 2021-04-11 MED ORDER — BACLOFEN 10 MG PO TABS: 10.0000 mg | ORAL_TABLET | Freq: Three times a day (TID) | ORAL | 0 refills | Status: DC

## 2021-05-28 ENCOUNTER — Encounter: Payer: Self-pay | Admitting: Family Medicine

## 2021-05-28 ENCOUNTER — Ambulatory Visit: Payer: BC Managed Care – PPO | Admitting: Family Medicine

## 2021-05-28 ENCOUNTER — Other Ambulatory Visit: Payer: Self-pay

## 2021-05-28 VITALS — BP 126/81 | HR 79 | Resp 16 | Wt 237.0 lb

## 2021-05-28 DIAGNOSIS — R350 Frequency of micturition: Secondary | ICD-10-CM | POA: Diagnosis not present

## 2021-05-28 DIAGNOSIS — N3289 Other specified disorders of bladder: Secondary | ICD-10-CM | POA: Diagnosis not present

## 2021-05-28 DIAGNOSIS — D219 Benign neoplasm of connective and other soft tissue, unspecified: Secondary | ICD-10-CM

## 2021-05-28 DIAGNOSIS — M545 Low back pain, unspecified: Secondary | ICD-10-CM

## 2021-05-28 DIAGNOSIS — G8929 Other chronic pain: Secondary | ICD-10-CM

## 2021-05-28 LAB — POCT URINALYSIS DIPSTICK
Bilirubin, UA: NEGATIVE
Blood, UA: POSITIVE
Glucose, UA: NEGATIVE
Ketones, UA: NEGATIVE
Nitrite, UA: NEGATIVE
Protein, UA: POSITIVE — AB
Spec Grav, UA: >=1.03 — AB (ref 1.010–1.025)
Urobilinogen, UA: 0.2 E.U./dL
pH, UA: 6 (ref 5.0–8.0)

## 2021-05-28 MED ORDER — NITROFURANTOIN MONOHYD MACRO 100 MG PO CAPS: 100.0000 mg | ORAL_CAPSULE | Freq: Two times a day (BID) | ORAL | 0 refills | Status: DC

## 2021-05-28 MED ORDER — OXYBUTYNIN CHLORIDE 5 MG PO TABS: 5.0000 mg | ORAL_TABLET | Freq: Three times a day (TID) | ORAL | 1 refills | Status: DC | PRN

## 2021-05-28 NOTE — Assessment & Plan Note (Signed)
Chronic concern Worse today than norma Has been standing most of [place] Encouraged frequent breaks, support shoes, and use of tylenol in addition to Mobic

## 2021-05-28 NOTE — Assessment & Plan Note (Signed)
Denies bleeding Unsure of last follow up with GYN; thought fibroid would disappear following menopause Recommend pt to call and see GYN for routine monitoring of mass

## 2021-05-28 NOTE — Progress Notes (Signed)
I,April Miller,acting as a scribe for Gwyneth Sprout, FNP.,have documented all relevant documentation on the behalf of Gwyneth Sprout, FNP,as directed by  Gwyneth Sprout, FNP while in the presence of Gwyneth Sprout, FNP.   Established patient visit   Patient: Janet Walters   DOB: 04/09/1961   60 y.o. Female  MRN: [id number] Visit Date: 05/28/2021  Today's healthcare provider: Gwyneth Sprout, FNP   Chief Complaint  Patient presents with   Urinary Frequency   Subjective  -------------------------------------------------------------------------------------------------------------  Urinary Frequency  This is a new problem. The current episode started 1 to 4 weeks ago (2 weeks ago). The problem has been unchanged. The patient is experiencing no pain. There has been no fever. Associated symptoms include frequency and urgency. Pertinent negatives include no chills, discharge, flank pain, hematuria, hesitancy, nausea, possible pregnancy or sweats. Treatments tried: AZO. The treatment provided no relief.       Patient states she has had urine frequency for around 2 weeks. Patient also has symptoms of urgency. Patient states she has been treating symptoms with AZO.     Medications: Outpatient Medications Prior to Visit  Medication Sig   lisinopril-hydrochlorothiazide (ZESTORETIC) 20-25 MG tablet Take 1 tablet by mouth daily.   meloxicam (MOBIC) 15 MG tablet Take 1 tablet (15 mg total) by mouth daily.   [DISCONTINUED] baclofen (LIORESAL) 10 MG tablet Take 1 tablet (10 mg total) by mouth 3 (three) times daily. (Patient not taking: Reported on 05/28/2021)   [DISCONTINUED] loperamide (IMODIUM A-D) 2 MG tablet Take 2 tablets (4 mg total) by mouth 4 (four) times daily as needed for diarrhea or loose stools. (Patient not taking: Reported on 05/28/2021)   [DISCONTINUED] sucralfate (CARAFATE) 1 g tablet Take 1 tablet (1 g total) by mouth 4 (four) times daily. (Patient not taking: Reported on 05/28/2021)    [DISCONTINUED] traZODone (DESYREL) 50 MG tablet Take by mouth. (Patient not taking: Reported on 05/28/2021)   No facility-administered medications prior to visit.    Review of Systems  Constitutional:  Negative for chills.  Gastrointestinal:  Negative for nausea.  Genitourinary:  Positive for frequency and urgency. Negative for flank pain, hematuria and hesitancy.   {Labs  Heme  Chem  Endocrine  Serology  Results Review (optional):[id number]}   Objective  -------------------------------------------------------------------------------------------------------------------- BP 126/81 (BP Location: Right Arm, Patient Position: Sitting, Cuff Size: Large)   Pulse 79   Resp 16   Wt 237 lb (107.5 kg)   SpO2 100%   BMI 38.25 kg/m     Physical Exam Vitals and nursing note reviewed.  Constitutional:      General: She is not in acute distress.    Appearance: She is obese. She is not ill-appearing, toxic-appearing or diaphoretic.  HENT:     Head: Normocephalic and atraumatic.  Cardiovascular:     Rate and Rhythm: Normal rate and regular rhythm.     Pulses: Normal pulses.     Heart sounds: Normal heart sounds. No murmur heard.   No friction rub. No gallop.  Pulmonary:     Effort: Pulmonary effort is normal.     Breath sounds: Normal breath sounds.  Abdominal:     Palpations: Abdomen is soft.     Tenderness: There is abdominal tenderness. There is no right CVA tenderness or left CVA tenderness.     Comments: Suprapubic tenderness on palpation   Musculoskeletal:        General: Normal range of motion.     Cervical  back: Normal range of motion.  Skin:    General: Skin is warm and dry.     Capillary Refill: Capillary refill takes less than 2 seconds.  Neurological:     General: No focal deficit present.     Mental Status: She is alert and oriented to person, place, and time.  Psychiatric:        Mood and Affect: Mood normal.        Behavior: Behavior normal.        Thought Content:  Thought content normal.      Results for orders placed or performed in visit on 05/28/21  POCT urinalysis dipstick  Result Value Ref Range   Color, UA Yellow    Clarity, UA Slightly Cloudy    Glucose, UA Negative Negative   Bilirubin, UA Negative    Ketones, UA Negative    Spec Grav, UA >=1.030 (A) 1.010 - 1.025   Blood, UA Positive    pH, UA 6.0 5.0 - 8.0   Protein, UA Positive (A) Negative   Urobilinogen, UA 0.2 0.2 or 1.0 E.U./dL   Nitrite, UA Negative    Leukocytes, UA Small (1+) (A) Negative    Assessment & Plan  ---------------------------------------------------------------------------------------------------------------------- Problem List Items Addressed This Visit       Other   Low back pain    Chronic concern Worse today than norma Has been standing most of [place] Encouraged frequent breaks, support shoes, and use of tylenol in addition to Mobic      Fibroid    Denies bleeding Unsure of last follow up with GYN; thought fibroid would disappear following menopause Recommend pt to call and see GYN for routine monitoring of mass      Urinary frequency - Primary    Ongoing concern. Pt under a lot of stress at work Drinking cranberry juice and AZO OTC; slight relief  UA done UCx pending  Start macrobid given leuks, prot, and focal tenderness.      Relevant Medications   nitrofurantoin, macrocrystal-monohydrate, (MACROBID) 100 MG capsule   Other Relevant Orders   POCT urinalysis dipstick (Completed)   Urine Culture   Bladder spasms    Denies incontinence Trial of anti-spasm medication to assist in decreased frequency      Relevant Medications   oxybutynin (DITROPAN) 5 MG tablet     Return if symptoms worsen or fail to improve.      Vonna Kotyk, FNP, have reviewed all documentation for this visit. The documentation on 05/28/21 for the exam, diagnosis, procedures, and orders are all accurate and complete.    Gwyneth Sprout, [place] [phone] (phone) [phone] (fax)  [place]

## 2021-05-28 NOTE — Assessment & Plan Note (Signed)
Denies incontinence Trial of anti-spasm medication to assist in decreased frequency

## 2021-05-28 NOTE — Assessment & Plan Note (Signed)
Ongoing concern. Pt under a lot of stress at work Drinking cranberry juice and AZO OTC; slight relief  UA done UCx pending  Start macrobid given leuks, prot, and focal tenderness.

## 2021-05-30 LAB — URINE CULTURE

## 2021-05-30 NOTE — Progress Notes (Signed)
Macrobid will treat the Klebsiella pneumoniae which grew from your urine culture.  Continue to take the medication until dosing is finished.  Call PCP if there are any ongoing concerns.  Thank you.

## 2021-06-05 ENCOUNTER — Telehealth: Payer: Self-pay | Admitting: Family Medicine

## 2021-06-05 NOTE — Telephone Encounter (Signed)
Pt called in regards to her most recent appt with Daneil Dan. She states that she was prescribed a medication for uti, but it is not helping and is requesting to have something different. Please advise.       [id number] [id number] Phone: [phone]: [phone] Hours: Not open 24 hours

## 2021-06-06 ENCOUNTER — Other Ambulatory Visit: Payer: Self-pay

## 2021-06-06 ENCOUNTER — Other Ambulatory Visit: Payer: Self-pay | Admitting: Family Medicine

## 2021-06-06 MED ORDER — SULFAMETHOXAZOLE-TRIMETHOPRIM 800-160 MG PO TABS: 1.0000 | ORAL_TABLET | Freq: Two times a day (BID) | ORAL | 0 refills | Status: AC

## 2021-06-13 ENCOUNTER — Ambulatory Visit
Admission: RE | Admit: 2021-06-13 | Discharge: 2021-06-13 | Disposition: A | Discharge status: Home / Self Care | Payer: BC Managed Care – PPO | Source: Ambulatory Visit | Attending: Family Medicine | Admitting: Family Medicine

## 2021-06-13 ENCOUNTER — Other Ambulatory Visit: Payer: Self-pay

## 2021-06-13 DIAGNOSIS — Z1231 Encounter for screening mammogram for malignant neoplasm of breast: Secondary | ICD-10-CM

## 2021-11-13 ENCOUNTER — Encounter: Payer: Self-pay | Admitting: Physician Assistant

## 2021-11-13 ENCOUNTER — Ambulatory Visit (INDEPENDENT_AMBULATORY_CARE_PROVIDER_SITE_OTHER): Payer: BC Managed Care – PPO | Admitting: Physician Assistant

## 2021-11-13 ENCOUNTER — Other Ambulatory Visit: Payer: Self-pay

## 2021-11-13 VITALS — BP 164/87 | HR 68 | Temp 98.6°F | Wt 238.0 lb

## 2021-11-13 DIAGNOSIS — Z6836 Body mass index (BMI) 36.0-36.9, adult: Secondary | ICD-10-CM

## 2021-11-13 DIAGNOSIS — Z0000 Encounter for general adult medical examination without abnormal findings: Secondary | ICD-10-CM | POA: Diagnosis not present

## 2021-11-13 DIAGNOSIS — I10 Essential (primary) hypertension: Secondary | ICD-10-CM | POA: Diagnosis not present

## 2021-11-13 DIAGNOSIS — M51369 Other intervertebral disc degeneration, lumbar region without mention of lumbar back pain or lower extremity pain: Secondary | ICD-10-CM

## 2021-11-13 DIAGNOSIS — M5136 Other intervertebral disc degeneration, lumbar region: Secondary | ICD-10-CM | POA: Diagnosis not present

## 2021-11-13 NOTE — Progress Notes (Signed)
Complete physical exam   Patient: Janet Walters   DOB: Mar 02, 1961   61 y.o. Female  MRN: [id number] Visit Date: 11/13/2021  Today's healthcare provider: Mikey Kirschner, PA-C   Cc. cpe  Subjective    Janet Walters is a 61 y.o. female who presents today for a complete physical exam.  She reports consuming a general diet. The patient does not participate in regular exercise at present. She generally feels well. She reports sleeping well. She does not have additional problems to discuss today.    Past Medical History:  Diagnosis Date   Back pain, chronic    DDD (degenerative disc disease), lumbar    Genital herpes    GERD (gastroesophageal reflux disease)    Hypertension    Insomnia    Past Surgical History:  Procedure Laterality Date   COLONOSCOPY     COLONOSCOPY WITH PROPOFOL N/A 09/18/2016   Procedure: COLONOSCOPY WITH PROPOFOL;  Surgeon: Manya Silvas, MD;  Location: [facility];  Service: Endoscopy;  Laterality: N/A;   Social History   Socioeconomic History   Marital status: Single    Spouse name: Not on file   Number of children: Not on file   Years of education: Not on file   Highest education level: Not on file  Occupational History   Not on file  Tobacco Use   Smoking status: Never   Smokeless tobacco: Never  Vaping Use   Vaping Use: Never used  Substance and Sexual Activity   Alcohol use: No    Alcohol/week: 0.0 standard drinks   Drug use: No   Sexual activity: Not on file  Other Topics Concern   Not on file  Social History Narrative   Not on file   Social Determinants of Health   Financial Resource Strain: Not on file  Food Insecurity: Not on file  Transportation Needs: Not on file  Physical Activity: Not on file  Stress: Not on file  Social Connections: Not on file  Intimate Partner Violence: Not on file   Family Status  Relation Name Status   Ethlyn Daniels  (Not Specified)   Ethlyn Daniels  (Not Specified)   Ethlyn Daniels  (Not Specified)    Family History  Problem Relation Age of Onset   Breast cancer Paternal Aunt    Breast cancer Paternal Aunt    Breast cancer Paternal Aunt    No Known Allergies  Patient Care Team: Mikey Kirschner, PA-C as PCP - General (Physician Assistant)   Medications: Outpatient Medications Prior to Visit  Medication Sig   lisinopril-hydrochlorothiazide (ZESTORETIC) 20-25 MG tablet Take 1 tablet by mouth daily.   meloxicam (MOBIC) 15 MG tablet Take 1 tablet (15 mg total) by mouth daily.   [DISCONTINUED] nitrofurantoin, macrocrystal-monohydrate, (MACROBID) 100 MG capsule Take 1 capsule (100 mg total) by mouth 2 (two) times daily.   [DISCONTINUED] oxybutynin (DITROPAN) 5 MG tablet Take 1 tablet (5 mg total) by mouth every 8 (eight) hours as needed for bladder spasms.   No facility-administered medications prior to visit.    Review of Systems  Constitutional: Negative.   HENT: Negative.    Eyes: Negative.   Respiratory: Negative.    Cardiovascular: Negative.   Gastrointestinal: Negative.   Endocrine: Negative.   Genitourinary:  Positive for frequency.  Musculoskeletal:  Positive for arthralgias, joint swelling and myalgias.  Skin: Negative.   Allergic/Immunologic: Negative.   Neurological: Negative.   Hematological: Negative.   Psychiatric/Behavioral: Negative.  Objective    BP (!) 164/87 Comment: repeat left arm   Pulse 68    Temp 98.6 F (37 C) (Oral)    Wt 238 lb (108 kg)    SpO2 100%    BMI 38.41 kg/m    Physical Exam Constitutional:      Appearance: Normal appearance. She is normal weight.  HENT:     Head: Normocephalic and atraumatic.     Right Ear: Tympanic membrane, ear canal and external ear normal.     Left Ear: Tympanic membrane, ear canal and external ear normal.     Nose: Nose normal.     Mouth/Throat:     Mouth: Mucous membranes are moist.     Pharynx: Oropharynx is clear.  Eyes:     Extraocular Movements: Extraocular movements intact.      Conjunctiva/sclera: Conjunctivae normal.     Pupils: Pupils are equal, round, and reactive to light.  Cardiovascular:     Rate and Rhythm: Normal rate and regular rhythm.     Pulses: Normal pulses.     Heart sounds: Normal heart sounds.  Pulmonary:     Effort: Pulmonary effort is normal.     Breath sounds: Normal breath sounds.  Abdominal:     General: Abdomen is flat. Bowel sounds are normal.     Palpations: Abdomen is soft.  Musculoskeletal:        General: Normal range of motion.     Cervical back: Normal range of motion and neck supple.  Skin:    General: Skin is warm and dry.  Neurological:     General: No focal deficit present.     Mental Status: She is alert and oriented to person, place, and time. Mental status is at baseline.  Psychiatric:        Mood and Affect: Mood normal.        Behavior: Behavior normal.        Thought Content: Thought content normal.        Judgment: Judgment normal.     Last depression screening scores PHQ 2/9 Scores 11/13/2021 02/10/2021 05/31/2017  PHQ - 2 Score 0 0 0  PHQ- 9 Score 1 4 -   Last fall risk screening Fall Risk  11/13/2021  Falls in the past year? 0  Number falls in past yr: -  Injury with Fall? -   Last Audit-C alcohol use screening Alcohol Use Disorder Test (AUDIT) 11/13/2021  1. How often do you have a drink containing alcohol? 0  2. How many drinks containing alcohol do you have on a typical [place] when you are drinking? -  3. How often do you have six or more drinks on one occasion? -  AUDIT-C Score -   A score of 3 or more in women, and 4 or more in men indicates increased risk for alcohol abuse, EXCEPT if all of the points are from question 1   No results found for any visits on 11/13/21.  Assessment & Plan    Routine Health Maintenance and Physical Exam  Immunization History  Administered Date(s) Administered   Hepatitis B, adult 12/23/2009, 05/02/2010   Meningococcal Conjugate 12/18/2009   Td 05/01/1998   Tdap  12/18/2009    Health Maintenance  Topic Date Due   COVID-19 Vaccine (1) Never done   TETANUS/TDAP  12/19/2019   Zoster Vaccines- Shingrix (2 of 2) 02/06/2020   PAP SMEAR-Modifier  11/13/2021 (Originally 05/30/2019)   INFLUENZA VACCINE  01/09/2022 (Originally 05/12/2021)  MAMMOGRAM  06/14/2023   COLONOSCOPY (Pts 45-57yrs Insurance coverage will need to be confirmed)  09/18/2026   Hepatitis C Screening  Completed   HPV VACCINES  Aged Out   HIV Screening  Discontinued    Discussed health benefits of physical activity, and encouraged her to engage in regular exercise appropriate for her age and condition.  -Would prefer to have her pap in the summer when its warmer. Denies complaints  Problem List Items Addressed This Visit       Cardiovascular and Mediastinum   Essential (primary) hypertension    Elevated and then further elevated on repeat. Pt admits to high stress levels today in regard to work. Advised she check at home 2-3 times a week first thing in AM or before bed. Return to office in the next 2-3 months for a bp check      Relevant Orders   Comprehensive Metabolic Panel (CMET)     Musculoskeletal and Integument   DDD (degenerative disc disease), lumbar    Admits to aggravation of symptoms, states her pain is also related to right hip arthritis, was told at one point she needs a hip replacement. Manages herself.      Other Visit Diagnoses     Encounter for health maintenance examination    -  Primary   BMI 36.0-36.9,adult       Relevant Orders   Lipid Profile   Comprehensive Metabolic Panel (CMET)   HgB A1c        Return in about 3 months (around 02/10/2022) for hypertension. 6 months for pap smear.     [place],acting as a scribe for Mikey Kirschner, PA-C.,have documented all relevant documentation on the behalf of Mikey Kirschner, PA-C,as directed by  Mikey Kirschner, PA-C while in the presence of Mikey Kirschner, PA-C.  I, Mikey Kirschner, PA-C have  reviewed all documentation for this visit. The documentation on  11/13/2021 for the exam, diagnosis, procedures, and orders are all accurate and complete.    Mikey Kirschner, PA-C  [facility] [phone] (phone) [phone] (fax)  [place]

## 2021-11-13 NOTE — Assessment & Plan Note (Signed)
Admits to aggravation of symptoms, states her pain is also related to right hip arthritis, was told at one point she needs a hip replacement. Manages herself.

## 2021-11-13 NOTE — Assessment & Plan Note (Signed)
Elevated and then further elevated on repeat. Pt admits to high stress levels today in regard to work. Advised she check at home 2-3 times a week first thing in AM or before bed. Return to office in the next 2-3 months for a bp check

## 2021-11-14 LAB — COMPREHENSIVE METABOLIC PANEL
ALT: 9 IU/L (ref 0–32)
AST: 12 IU/L (ref 0–40)
Albumin/Globulin Ratio: 1.9 (ref 1.2–2.2)
Albumin: 4.5 g/dL (ref 3.8–4.8)
Alkaline Phosphatase: 106 IU/L (ref 44–121)
BUN/Creatinine Ratio: 15 (ref 12–28)
BUN: 12 mg/dL (ref 8–27)
Bilirubin Total: 0.5 mg/dL (ref 0.0–1.2)
CO2: 25 mmol/L (ref 20–29)
Calcium: 9.6 mg/dL (ref 8.7–10.3)
Chloride: 103 mmol/L (ref 96–106)
Creatinine, Ser: 0.8 mg/dL (ref 0.57–1.00)
Globulin, Total: 2.4 g/dL (ref 1.5–4.5)
Glucose: 93 mg/dL (ref 70–99)
Potassium: 4.1 mmol/L (ref 3.5–5.2)
Sodium: 142 mmol/L (ref 134–144)
Total Protein: 6.9 g/dL (ref 6.0–8.5)
eGFR: 83 mL/min/{1.73_m2} (ref >59)

## 2021-11-14 LAB — LIPID PANEL
Chol/HDL Ratio: 3.5 ratio (ref 0.0–4.4)
Cholesterol, Total: 197 mg/dL (ref 100–199)
HDL: 57 mg/dL (ref >39)
LDL Chol Calc (NIH): 117 mg/dL — ABNORMAL HIGH (ref 0–99)
Triglycerides: 130 mg/dL (ref 0–149)
VLDL Cholesterol Cal: 23 mg/dL (ref 5–40)

## 2021-11-14 LAB — HEMOGLOBIN A1C
Est. average glucose Bld gHb Est-mCnc: 137 mg/dL
Hgb A1c MFr Bld: 6.4 % — ABNORMAL HIGH (ref 4.8–5.6)

## 2022-01-19 ENCOUNTER — Ambulatory Visit: Payer: Self-pay | Admitting: *Deleted

## 2022-01-19 NOTE — Telephone Encounter (Signed)
I returned pt's call.  C/o having issues with her legs today.   Requesting medication be sent in.  Has had trouble in the past.   Knee swollen.   Both legs hurt. ? ?Voicemail left to call back ? ? ?

## 2022-01-19 NOTE — Telephone Encounter (Signed)
?  Chief Complaint: bilateral leg pain and left knee swelling, requesting medication ?Symptoms: hip, legs painful. Can walk and work. Left knee swelling not sure if occurred after working and possibly twisted knee. Wearing a brace for support on left knee.  ?Frequency: x 1 week  ?Pertinent Negatives: Patient denies N/T in legs. No swelling in legs no rash no weakness ?Disposition: '[]'$ ED /'[]'$ Urgent Care (no appt availability in office) / '[x]'$ Appointment(In office/virtual)/ '[]'$  [place]/ '[]'$ Home Care/ '[]'$ Refused Recommended Disposition /'[]'$ [place] [facility]/ '[]'$  Follow-up with PCP ?Additional Notes:  ? ?Na ? ? ? Reason for Disposition ? [1] MILD pain (e.g., does not interfere with normal activities) AND [2] present > 7 days ? ?Answer Assessment - Initial Assessment Questions ?1. ONSET: "When did the pain start?"  ?    On and off for years  ?2. LOCATION: "Where is the pain located?"  ?    Bilateral legs and left knee ?3. PAIN: "How bad is the pain?"    (Scale 1-10; or mild, moderate, severe) ?  -  MILD (1-3): doesn't interfere with normal activities  ?  -  MODERATE (4-7): interferes with normal activities (e.g., work or school) or awakens from sleep, limping  ?  -  SEVERE (8-10): excruciating pain, unable to do any normal activities, unable to walk ?    Worsening pain can walk and work ?4. WORK OR EXERCISE: "Has there been any recent work or exercise that involved this part of the body?"  ?    Possible twisting of knee at work  ?5. CAUSE: "What do you think is causing the leg pain?" ?    Not sure  ?6. OTHER SYMPTOMS: "Do you have any other symptoms?" (e.g., chest pain, back pain, breathing difficulty, swelling, rash, fever, numbness, weakness) ?    Hip pain and ache down legs  ?7. PREGNANCY: "Is there any chance you are pregnant?" "When was your last menstrual period?" ?    na ? ?Protocols used: Leg Pain-A-AH ? ?

## 2022-01-22 ENCOUNTER — Encounter: Payer: Self-pay | Admitting: Physician Assistant

## 2022-01-22 ENCOUNTER — Ambulatory Visit: Payer: BC Managed Care – PPO | Admitting: Physician Assistant

## 2022-01-22 VITALS — BP 139/79 | HR 73 | Temp 97.8°F | Resp 14 | Ht 68.0 in | Wt 243.0 lb

## 2022-01-22 DIAGNOSIS — M1611 Unilateral primary osteoarthritis, right hip: Secondary | ICD-10-CM

## 2022-01-22 DIAGNOSIS — M5442 Lumbago with sciatica, left side: Secondary | ICD-10-CM | POA: Diagnosis not present

## 2022-01-22 DIAGNOSIS — M5441 Lumbago with sciatica, right side: Secondary | ICD-10-CM

## 2022-01-22 DIAGNOSIS — G8929 Other chronic pain: Secondary | ICD-10-CM

## 2022-01-22 MED ORDER — TRAMADOL HCL 50 MG PO TABS: 50.0000 mg | ORAL_TABLET | Freq: Three times a day (TID) | ORAL | 0 refills | Status: AC | PRN

## 2022-01-22 NOTE — Assessment & Plan Note (Signed)
Chronic, bilateral with radiation/sciatica pain.  ?May be exacerbated by b/l hip osteoarthritis ?Discussed steroid taper, pt declined. ?rx tramadol 50 mg q 8 hrs x 5 days.  ?Ref back to ortho for lumbar w/u as this is more likely the etiology of her bilateral nearly symmetric pain ?

## 2022-01-22 NOTE — Progress Notes (Signed)
?  ? ?I,Roshena L Chambers,acting as a scribe for [employer], PA-C.,have documented all relevant documentation on the behalf of Mikey Kirschner, PA-C,as directed by  Mikey Kirschner, PA-C while in the presence of Mikey Kirschner, PA-C.  ? ? ?Established patient visit ? ? ?Patient: Janet Walters   DOB: Feb 02, 1961   61 y.o. Female  MRN: [id number] ?Visit Date: 01/22/2022 ? ?Today's healthcare provider: Mikey Kirschner, PA-C  ? ?Chief Complaint  ?Patient presents with  ? Leg Pain  ? ?Subjective  ?  ? ?Chronic bilateral leg pain ?Originally thought to be from b/l hip osteoarthritis. She feels the pain b/l buttock down to her toes. Feels it is exacerbated currently. She reports being very active at work and [employer] frequently. Pain is felt the most at night lying down. ?Denies numbness.  ? ?Medications: ?Outpatient Medications Prior to Visit  ?Medication Sig  ? lisinopril-hydrochlorothiazide (ZESTORETIC) 20-25 MG tablet Take 1 tablet by mouth daily.  ? meloxicam (MOBIC) 15 MG tablet Take 1 tablet (15 mg total) by mouth daily.  ? ?No facility-administered medications prior to visit.  ? ? ?Review of Systems  ?Constitutional:  Negative for appetite change, chills, fatigue and fever.  ?Respiratory:  Negative for chest tightness and shortness of breath.   ?Cardiovascular:  Negative for chest pain and palpitations.  ?Gastrointestinal:  Negative for abdominal pain, nausea and vomiting.  ?Neurological:  Negative for dizziness and weakness.  ? ? ?  Objective  ?  ?BP 139/79 (BP Location: Left Arm, Patient Position: Sitting, Cuff Size: Large)   Pulse 73   Temp 97.8 ?F (36.6 ?C) (Oral)   Resp 14   Ht '5\' 8"'$  (1.727 m)   Wt 243 lb (110.2 kg)   SpO2 100% Comment: room air  BMI 36.95 kg/m?  ? ? ?Physical Exam ?Constitutional:   ?   General: She is awake.  ?   Appearance: She is well-developed.  ?HENT:  ?   Head: Normocephalic.  ?Eyes:  ?   Conjunctiva/sclera: Conjunctivae normal.  ?Cardiovascular:  ?   Rate and Rhythm: Normal rate  and regular rhythm.  ?   Heart sounds: Normal heart sounds.  ?Pulmonary:  ?   Effort: Pulmonary effort is normal.  ?   Breath sounds: Normal breath sounds.  ?Skin: ?   General: Skin is warm.  ?Neurological:  ?   Mental Status: She is alert and oriented to person, place, and time.  ?Psychiatric:     ?   Attention and Perception: Attention normal.     ?   Mood and Affect: Mood normal.     ?   Speech: Speech normal.     ?   Behavior: Behavior is cooperative.  ?  ? ?No results found for any visits on 01/22/22. ? Assessment & Plan  ?  ? ?Problem List Items Addressed This Visit   ? ?  ? Other  ? Low back pain - Primary  ?  Chronic, bilateral with radiation/sciatica pain.  ?May be exacerbated by b/l hip osteoarthritis ?Discussed steroid taper, pt declined. ?rx tramadol 50 mg q 8 hrs x 5 days.  ?Ref back to ortho for lumbar w/u as this is more likely the etiology of her bilateral nearly symmetric pain ?  ?  ? Relevant Medications  ? traMADol (ULTRAM) 50 MG tablet  ? Other Relevant Orders  ? Ambulatory referral to Orthopedics  ? ?Other Visit Diagnoses   ? ? Primary osteoarthritis of right hip      ?  Relevant Medications  ? traMADol (ULTRAM) 50 MG tablet  ? Other Relevant Orders  ? Ambulatory referral to Orthopedics  ? ?  ?  ? ?Return as scheduled for prediabetes f/u.  ?   ? ?I, Mikey Kirschner, PA-C have reviewed all documentation for this visit. The documentation on  01/22/2022  for the exam, diagnosis, procedures, and orders are all accurate and complete. ? ?Mikey Kirschner, PA-C ?[facility] [id number] ?[place], [place], [address] ?Office: [phone] ?Fax: [phone]  ? ?[facility]  ?

## 2022-02-06 DIAGNOSIS — M5137 Other intervertebral disc degeneration, lumbosacral region: Secondary | ICD-10-CM | POA: Diagnosis not present

## 2022-02-06 DIAGNOSIS — M47816 Spondylosis without myelopathy or radiculopathy, lumbar region: Secondary | ICD-10-CM | POA: Diagnosis not present

## 2022-02-06 DIAGNOSIS — M25551 Pain in right hip: Secondary | ICD-10-CM | POA: Diagnosis not present

## 2022-02-06 DIAGNOSIS — M5442 Lumbago with sciatica, left side: Secondary | ICD-10-CM | POA: Diagnosis not present

## 2022-02-10 ENCOUNTER — Other Ambulatory Visit: Payer: Self-pay | Admitting: Sports Medicine

## 2022-02-10 DIAGNOSIS — M5137 Other intervertebral disc degeneration, lumbosacral region: Secondary | ICD-10-CM

## 2022-02-10 DIAGNOSIS — G8929 Other chronic pain: Secondary | ICD-10-CM

## 2022-02-10 DIAGNOSIS — M25552 Pain in left hip: Secondary | ICD-10-CM

## 2022-02-10 DIAGNOSIS — M47816 Spondylosis without myelopathy or radiculopathy, lumbar region: Secondary | ICD-10-CM

## 2022-02-12 ENCOUNTER — Ambulatory Visit: Payer: BC Managed Care – PPO | Admitting: Physician Assistant

## 2022-02-12 ENCOUNTER — Ambulatory Visit
Admission: RE | Admit: 2022-02-12 | Discharge: 2022-02-12 | Disposition: A | Discharge status: Home / Self Care | Payer: BC Managed Care – PPO | Source: Ambulatory Visit | Attending: Sports Medicine | Admitting: Sports Medicine

## 2022-02-12 DIAGNOSIS — M5442 Lumbago with sciatica, left side: Secondary | ICD-10-CM | POA: Insufficient documentation

## 2022-02-12 DIAGNOSIS — M25551 Pain in right hip: Secondary | ICD-10-CM | POA: Insufficient documentation

## 2022-02-12 DIAGNOSIS — M25552 Pain in left hip: Secondary | ICD-10-CM | POA: Diagnosis not present

## 2022-02-12 DIAGNOSIS — G8929 Other chronic pain: Secondary | ICD-10-CM | POA: Insufficient documentation

## 2022-02-12 DIAGNOSIS — M47816 Spondylosis without myelopathy or radiculopathy, lumbar region: Secondary | ICD-10-CM | POA: Diagnosis not present

## 2022-02-12 DIAGNOSIS — M5441 Lumbago with sciatica, right side: Secondary | ICD-10-CM | POA: Insufficient documentation

## 2022-02-12 DIAGNOSIS — M5137 Other intervertebral disc degeneration, lumbosacral region: Secondary | ICD-10-CM | POA: Insufficient documentation

## 2022-02-12 DIAGNOSIS — M4316 Spondylolisthesis, lumbar region: Secondary | ICD-10-CM | POA: Diagnosis not present

## 2022-02-12 DIAGNOSIS — M545 Low back pain, unspecified: Secondary | ICD-10-CM | POA: Diagnosis not present

## 2022-02-16 ENCOUNTER — Telehealth: Payer: Self-pay | Admitting: Physician Assistant

## 2022-02-16 DIAGNOSIS — I10 Essential (primary) hypertension: Secondary | ICD-10-CM

## 2022-02-16 MED ORDER — LISINOPRIL-HYDROCHLOROTHIAZIDE 20-25 MG PO TABS: 1.0000 | ORAL_TABLET | Freq: Every day | ORAL | 3 refills | Status: DC

## 2022-02-16 NOTE — Telephone Encounter (Signed)
[facility] faxed refill request for the following medications: ? ?lisinopril-hydrochlorothiazide (ZESTORETIC) 20-25 MG tablet  ? ?Please advise. ? ?

## 2022-02-20 DIAGNOSIS — M247 Protrusio acetabuli: Secondary | ICD-10-CM | POA: Diagnosis not present

## 2022-02-20 DIAGNOSIS — M5416 Radiculopathy, lumbar region: Secondary | ICD-10-CM | POA: Diagnosis not present

## 2022-02-20 DIAGNOSIS — M160 Bilateral primary osteoarthritis of hip: Secondary | ICD-10-CM | POA: Diagnosis not present

## 2022-02-20 DIAGNOSIS — R1030 Lower abdominal pain, unspecified: Secondary | ICD-10-CM | POA: Diagnosis not present

## 2022-02-20 DIAGNOSIS — M5136 Other intervertebral disc degeneration, lumbar region: Secondary | ICD-10-CM | POA: Diagnosis not present

## 2022-02-20 DIAGNOSIS — R1032 Left lower quadrant pain: Secondary | ICD-10-CM | POA: Diagnosis not present

## 2022-02-20 DIAGNOSIS — M25751 Osteophyte, right hip: Secondary | ICD-10-CM | POA: Diagnosis not present

## 2022-02-20 DIAGNOSIS — R1031 Right lower quadrant pain: Secondary | ICD-10-CM | POA: Diagnosis not present

## 2022-04-13 ENCOUNTER — Other Ambulatory Visit: Payer: Self-pay | Admitting: Physician Assistant

## 2022-04-13 DIAGNOSIS — M1611 Unilateral primary osteoarthritis, right hip: Secondary | ICD-10-CM

## 2022-04-13 NOTE — Telephone Encounter (Signed)
Medication Refill - Medication: Gabapentin  meloxicam (MOBIC) 15 MG tablet  Has the patient contacted their pharmacy? Yes.   (Agent: If no, request that the patient contact the pharmacy for the refill. If patient does not wish to contact the pharmacy document the reason why and proceed with request.) (Agent: If yes, when and what did the pharmacy advise?)  Preferred Pharmacy (with phone number or street name):  [id number] [address] [place] Phone: [phone]: [phone]  Has the patient been seen for an appointment in the last year OR does the patient have an upcoming appointment? Yes.    Agent: Please be advised that RX refills may take up to 3 business days. We ask that you follow-up with your pharmacy.

## 2022-04-15 NOTE — Telephone Encounter (Signed)
Requested medication (s) are due for refill today: Yes  Requested medication (s) are on the active medication list: Yes  Last refill:  02/10/21  Future visit scheduled: No  Notes to clinic:  Prescription has expired.    Requested Prescriptions  Pending Prescriptions Disp Refills   meloxicam (MOBIC) 15 MG tablet 90 tablet 2    Sig: Take 1 tablet (15 mg total) by mouth daily.     Analgesics:  COX2 Inhibitors Failed - 04/13/2022 12:44 PM      Failed - Manual Review: Labs are only required if the patient has taken medication for more than 8 weeks.      Failed - HGB in normal range and within 360 days    Hemoglobin  Date Value Ref Range Status  02/11/2021 13.0 11.1 - 15.9 g/dL Final         Failed - HCT in normal range and within 360 days    Hematocrit  Date Value Ref Range Status  02/11/2021 40.3 34.0 - 46.6 % Final         Passed - Cr in normal range and within 360 days    Creatinine, Ser  Date Value Ref Range Status  11/13/2021 0.80 0.57 - 1.00 mg/dL Final         Passed - AST in normal range and within 360 days    AST  Date Value Ref Range Status  11/13/2021 12 0 - 40 IU/L Final         Passed - ALT in normal range and within 360 days    ALT  Date Value Ref Range Status  11/13/2021 9 0 - 32 IU/L Final         Passed - eGFR is 30 or above and within 360 days    GFR calc Af Amer  Date Value Ref Range Status  04/17/2018 >60 >60 mL/min Final    Comment:    (NOTE) The eGFR has been calculated using the CKD EPI equation. This calculation has not been validated in all clinical situations. eGFR's persistently <60 mL/min signify possible Chronic Kidney Disease.    GFR calc non Af Amer  Date Value Ref Range Status  04/17/2018 >60 >60 mL/min Final   eGFR  Date Value Ref Range Status  11/13/2021 83 >59 mL/min/1.73 Final         Passed - Patient is not pregnant      Passed - Valid encounter within last 12 months    Recent Outpatient Visits           2  months ago Chronic bilateral low back pain with bilateral sciatica   [facility] Mikey Kirschner, PA-C   5 months ago Encounter for health maintenance examination   [facility] Thedore Mins, [place], PA-C   10 months ago Urinary frequency   [facility] Gwyneth Sprout, [place]   1 year ago Primary osteoarthritis of right hip   [place], Vickki Muff, PA-C   2 years ago Primary osteoarthritis of right hip   [place], Vickki Muff, [place]

## 2022-04-21 ENCOUNTER — Other Ambulatory Visit: Payer: Self-pay | Admitting: Physician Assistant

## 2022-04-21 DIAGNOSIS — M1611 Unilateral primary osteoarthritis, right hip: Secondary | ICD-10-CM

## 2022-04-21 DIAGNOSIS — M47816 Spondylosis without myelopathy or radiculopathy, lumbar region: Secondary | ICD-10-CM

## 2022-04-21 DIAGNOSIS — M5442 Lumbago with sciatica, left side: Secondary | ICD-10-CM

## 2022-04-22 NOTE — Telephone Encounter (Signed)
Requested medication (s) are due for refill today- expired Rx  Requested medication (s) are on the active medication list -yes/no  Future visit scheduled yes  Last refill: Meloxicam- 02/10/21 #90 2RF- expired Rx, prescribing provider no longer at practice                 Gabapentin- not on current medication list  Notes to clinic: see above- sent for review of request   Requested Prescriptions  Pending Prescriptions Disp Refills   meloxicam (MOBIC) 15 MG tablet [Pharmacy Med Name: MELOXICAM 15 MG TABLET] 30 tablet     Sig: TAKE 1 TABLET BY MOUTH EVERY [place]     Analgesics:  COX2 Inhibitors Failed - 04/21/2022  6:32 PM      Failed - Manual Review: Labs are only required if the patient has taken medication for more than 8 weeks.      Failed - HGB in normal range and within 360 days    Hemoglobin  Date Value Ref Range Status  02/11/2021 13.0 11.1 - 15.9 g/dL Final         Failed - HCT in normal range and within 360 days    Hematocrit  Date Value Ref Range Status  02/11/2021 40.3 34.0 - 46.6 % Final         Passed - Cr in normal range and within 360 days    Creatinine, Ser  Date Value Ref Range Status  11/13/2021 0.80 0.57 - 1.00 mg/dL Final         Passed - AST in normal range and within 360 days    AST  Date Value Ref Range Status  11/13/2021 12 0 - 40 IU/L Final         Passed - ALT in normal range and within 360 days    ALT  Date Value Ref Range Status  11/13/2021 9 0 - 32 IU/L Final         Passed - eGFR is 30 or above and within 360 days    GFR calc Af Amer  Date Value Ref Range Status  04/17/2018 >60 >60 mL/min Final    Comment:    (NOTE) The eGFR has been calculated using the CKD EPI equation. This calculation has not been validated in all clinical situations. eGFR's persistently <60 mL/min signify possible Chronic Kidney Disease.    GFR calc non Af Amer  Date Value Ref Range Status  04/17/2018 >60 >60 mL/min Final   eGFR  Date Value Ref Range Status   11/13/2021 83 >59 mL/min/1.73 Final         Passed - Patient is not pregnant      Passed - Valid encounter within last 12 months    Recent Outpatient Visits           3 months ago Chronic bilateral low back pain with bilateral sciatica   [facility] Mikey Kirschner, PA-C   5 months ago Encounter for health maintenance examination   [facility] Thedore Mins, [place], PA-C   10 months ago Urinary frequency   [facility] Gwyneth Sprout, [place]   1 year ago Primary osteoarthritis of right hip   [place], Vickki Muff, PA-C   2 years ago Primary osteoarthritis of right hip   [place], Vickki Muff, PA-C       Future Appointments             In 5 days Drubel, [place] [facility], [facility]             gabapentin (NEURONTIN) 300 MG capsule [Pharmacy Med Name: GABAPENTIN 300 MG CAPSULE] 30 capsule     Sig: TAKE 1 CAPSULE (300 MG TOTAL) BY MOUTH EVERY [place] AT BEDTIME     Neurology: Anticonvulsants - gabapentin Passed - 04/21/2022  6:32 PM      Passed - Cr in normal range and within 360 days    Creatinine, Ser  Date Value Ref Range Status  11/13/2021 0.80 0.57 - 1.00 mg/dL Final         Passed - Completed PHQ-2 or PHQ-9 in the last 360 days      Passed - Valid encounter within last 12 months    Recent Outpatient Visits           3 months ago Chronic bilateral low back pain with bilateral sciatica   [facility] Mikey Kirschner, PA-C   5 months ago Encounter for health maintenance examination   [facility] Thedore Mins, [place], PA-C   10 months ago Urinary frequency   [facility] Gwyneth Sprout, [place]   1 year ago Primary osteoarthritis of right hip   [place], Vickki Muff, PA-C   2 years ago Primary osteoarthritis of right hip   [employer], Vickki Muff, PA-C       Future Appointments              In 5 days Drubel, Ria Comment, PA-C [employer], [facility]               Requested Prescriptions  Pending Prescriptions Disp Refills   meloxicam (MOBIC) 15 MG tablet [Pharmacy Med Name: MELOXICAM 15 MG TABLET] 30 tablet     Sig: TAKE 1 TABLET BY MOUTH EVERY [place]     Analgesics:  COX2 Inhibitors Failed - 04/21/2022  6:32 PM      Failed - Manual Review: Labs are only required if the patient has taken medication for more than 8 weeks.      Failed - HGB in normal range and within 360 days    Hemoglobin  Date Value Ref Range Status  02/11/2021 13.0 11.1 - 15.9 g/dL Final         Failed - HCT in normal range and within 360 days    Hematocrit  Date Value Ref Range Status  02/11/2021 40.3 34.0 - 46.6 % Final         Passed - Cr in normal range and within 360 days    Creatinine, Ser  Date Value Ref Range Status  11/13/2021 0.80 0.57 - 1.00 mg/dL Final         Passed - AST in normal range and within 360 days    AST  Date Value Ref Range Status  11/13/2021 12 0 - 40 IU/L Final         Passed - ALT in normal range and within 360 days    ALT  Date Value Ref Range Status  11/13/2021 9 0 - 32 IU/L Final         Passed - eGFR is 30 or above and within 360 days    GFR calc Af Amer  Date Value Ref Range Status  04/17/2018 >60 >60 mL/min Final    Comment:    (NOTE) The eGFR has been calculated using the CKD EPI equation. This calculation has not been validated in all clinical situations. eGFR's persistently <60 mL/min signify possible  Chronic Kidney Disease.    GFR calc non Af Amer  Date Value Ref Range Status  04/17/2018 >60 >60 mL/min Final   eGFR  Date Value Ref Range Status  11/13/2021 83 >59 mL/min/1.73 Final         Passed - Patient is not pregnant      Passed - Valid encounter within last 12 months    Recent Outpatient Visits           3 months ago Chronic bilateral low back pain with bilateral sciatica   [facility]  Thedore Mins, [place], PA-C   5 months ago Encounter for health maintenance examination   [facility] Thedore Mins, [place], PA-C   10 months ago Urinary frequency   [facility] Tally Joe T, [place]   1 year ago Primary osteoarthritis of right hip   [place], Vickki Muff, PA-C   2 years ago Primary osteoarthritis of right hip   [employer], Vickki Muff, PA-C       Future Appointments             In 5 days Drubel, Ria Comment, PA-C [employer], [facility]             gabapentin (NEURONTIN) 300 MG capsule [employer] Med Name: GABAPENTIN 300 MG CAPSULE] 30 capsule     Sig: TAKE 1 CAPSULE (300 MG TOTAL) BY MOUTH EVERY [place] AT BEDTIME     Neurology: Anticonvulsants - gabapentin Passed - 04/21/2022  6:32 PM      Passed - Cr in normal range and within 360 days    Creatinine, Ser  Date Value Ref Range Status  11/13/2021 0.80 0.57 - 1.00 mg/dL Final         Passed - Completed PHQ-2 or PHQ-9 in the last 360 days      Passed - Valid encounter within last 12 months    Recent Outpatient Visits           3 months ago Chronic bilateral low back pain with bilateral sciatica   [facility] Mikey Kirschner, PA-C   5 months ago Encounter for health maintenance examination   [employer], [place], PA-C   10 months ago Urinary frequency   [facility] Gwyneth Sprout, [place]   1 year ago Primary osteoarthritis of right hip   [place], Vickki Muff, PA-C   2 years ago Primary osteoarthritis of right hip   [employer], Vickki Muff, PA-C       Future Appointments             In 5 days Drubel, Ria Comment, PA-C [employer], [place]

## 2022-04-27 ENCOUNTER — Encounter: Payer: Self-pay | Admitting: Physician Assistant

## 2022-04-27 ENCOUNTER — Ambulatory Visit: Payer: BC Managed Care – PPO | Admitting: Physician Assistant

## 2022-04-27 VITALS — BP 129/84 | HR 75 | Ht 67.0 in | Wt 246.8 lb

## 2022-04-27 DIAGNOSIS — G8929 Other chronic pain: Secondary | ICD-10-CM | POA: Diagnosis not present

## 2022-04-27 DIAGNOSIS — M25551 Pain in right hip: Secondary | ICD-10-CM | POA: Diagnosis not present

## 2022-04-27 DIAGNOSIS — M5442 Lumbago with sciatica, left side: Secondary | ICD-10-CM

## 2022-04-27 DIAGNOSIS — Z23 Encounter for immunization: Secondary | ICD-10-CM

## 2022-04-27 DIAGNOSIS — M47816 Spondylosis without myelopathy or radiculopathy, lumbar region: Secondary | ICD-10-CM

## 2022-04-27 DIAGNOSIS — M25552 Pain in left hip: Secondary | ICD-10-CM | POA: Diagnosis not present

## 2022-04-27 DIAGNOSIS — R7303 Prediabetes: Secondary | ICD-10-CM | POA: Diagnosis not present

## 2022-04-27 DIAGNOSIS — M1611 Unilateral primary osteoarthritis, right hip: Secondary | ICD-10-CM

## 2022-04-27 LAB — POCT GLYCOSYLATED HEMOGLOBIN (HGB A1C): Hemoglobin A1C: 6.4 % — AB (ref 4.0–5.6)

## 2022-04-27 MED ORDER — GABAPENTIN 300 MG PO CAPS: ORAL_CAPSULE | ORAL | 1 refills | Status: DC

## 2022-04-27 MED ORDER — MELOXICAM 15 MG PO TABS: 15.0000 mg | ORAL_TABLET | Freq: Every day | ORAL | 1 refills | Status: DC

## 2022-04-27 NOTE — Progress Notes (Signed)
      Established patient visit   Patient: Janet Walters   DOB: 1961/02/23   61 y.o. Female  MRN: [id number] Visit Date: 04/27/2022  Today's healthcare provider: Mikey Kirschner, PA-C   Cc. Hip pain f/u  Subjective    HPI   Follow up for chronic hip pain  The patient was last seen for this 3 months ago. She followed up at [facility] for an initial appointment but did not return. They gave her a course of steroids and advised she continue Mobic and Gabapentin. An injection was discussed.  Changes made at last visit include continue current treatment  She reports fair compliance with treatment. She feels that condition is Unchanged. She is not having side effects.   -----------------------------------------------------------------------------------------  Prediabetes, Follow-up  Lab Results  Component Value Date   HGBA1C 6.4 (H) 11/13/2021   HGBA1C 5.8 (H) 05/31/2017   HGBA1C 5.5 05/01/2016   GLUCOSE 93 11/13/2021   GLUCOSE 87 02/11/2021   GLUCOSE 102 (H) 04/17/2018    Last seen for for this5 months ago.  Management since that visit includes changes in lifestyle and diet, no medications. Current symptoms include none and have been stable.  Prior visit with dietician: no Current diet: well balanced Current exercise: yard work  Pertinent Labs:    Component Value Date/Time   CHOL 197 11/13/2021 1552   TRIG 130 11/13/2021 1552   CHOLHDL 3.5 11/13/2021 1552   CREATININE 0.80 11/13/2021 1552    Wt Readings from Last 3 Encounters:  01/22/22 243 lb (110.2 kg)  11/13/21 238 lb (108 kg)  05/28/21 237 lb (107.5 kg)    -----------------------------------------------------------------------------------------   Medications: Outpatient Medications Prior to Visit  Medication Sig   gabapentin (NEURONTIN) 300 MG capsule TAKE 1 CAPSULE (300 MG TOTAL) BY MOUTH EVERY [place] AT BEDTIME   lisinopril-hydrochlorothiazide (ZESTORETIC) 20-25 MG tablet Take 1 tablet by mouth  daily.   meloxicam (MOBIC) 15 MG tablet TAKE 1 TABLET BY MOUTH EVERY [place]   No facility-administered medications prior to visit.    Review of Systems     Objective    Blood pressure 129/84, pulse 75, height '5\' 7"'$  (1.702 m), weight 246 lb 12.8 oz (111.9 kg), SpO2 96 %.   Physical Exam  ***  No results found for any visits on 04/27/22.  Assessment & Plan     ***  No follow-ups on file.      I, Mikey Kirschner, PA-C have reviewed all documentation for this visit. The documentation on 04/27/2022 for the exam, diagnosis, procedures, and orders are all accurate and complete.Mikey Kirschner, PA-C [facility] [address] [place], [place], [address] Office: [phone] Fax: [place]

## 2022-04-28 ENCOUNTER — Encounter: Payer: Self-pay | Admitting: Physician Assistant

## 2022-04-28 DIAGNOSIS — G8929 Other chronic pain: Secondary | ICD-10-CM | POA: Insufficient documentation

## 2022-04-28 DIAGNOSIS — R7303 Prediabetes: Secondary | ICD-10-CM | POA: Insufficient documentation

## 2022-04-28 LAB — COMPREHENSIVE METABOLIC PANEL
ALT: 12 IU/L (ref 0–32)
AST: 14 IU/L (ref 0–40)
Albumin/Globulin Ratio: 1.9 (ref 1.2–2.2)
Albumin: 4.7 g/dL (ref 3.9–4.9)
Alkaline Phosphatase: 99 IU/L (ref 44–121)
BUN/Creatinine Ratio: 17 (ref 12–28)
BUN: 14 mg/dL (ref 8–27)
Bilirubin Total: 0.3 mg/dL (ref 0.0–1.2)
CO2: 24 mmol/L (ref 20–29)
Calcium: 9.9 mg/dL (ref 8.7–10.3)
Chloride: 103 mmol/L (ref 96–106)
Creatinine, Ser: 0.83 mg/dL (ref 0.57–1.00)
Globulin, Total: 2.5 g/dL (ref 1.5–4.5)
Glucose: 100 mg/dL — ABNORMAL HIGH (ref 70–99)
Potassium: 3.7 mmol/L (ref 3.5–5.2)
Sodium: 142 mmol/L (ref 134–144)
Total Protein: 7.2 g/dL (ref 6.0–8.5)
eGFR: 80 mL/min/{1.73_m2} (ref >59)

## 2022-04-28 NOTE — Assessment & Plan Note (Signed)
Still prediabetic Continue control w/ diet and exercise

## 2022-04-28 NOTE — Assessment & Plan Note (Signed)
Pt was eval by ortho and phys med pain thought to be secondary to b/l hip degeneration and minimally d/t spinal issues Pt was advised to f/u back with ortho for injection/med management but pt disliked center and would prefer another referral. Placed. Will continue to fill gabapentin and mobic for pain management

## 2022-05-20 ENCOUNTER — Telehealth: Payer: Self-pay

## 2022-05-20 NOTE — Telephone Encounter (Signed)
Copied from [place] [id number]. Topic: Referral - Request for Referral >> May 19, 2022  3:22 PM [place] B wrote: Reason for CRM: Referral Request - Has patient seen PCP for this complaint? no *If NO, is insurance requiring patient see PCP for this issue before PCP can refer them? Referral for which specialty: colonoscopy Preferred provider/office: N/A Reason for referral: routine colonoscopy

## 2022-05-28 ENCOUNTER — Other Ambulatory Visit: Payer: Self-pay | Admitting: Physician Assistant

## 2022-05-28 DIAGNOSIS — Z1231 Encounter for screening mammogram for malignant neoplasm of breast: Secondary | ICD-10-CM

## 2022-06-03 DIAGNOSIS — M160 Bilateral primary osteoarthritis of hip: Secondary | ICD-10-CM | POA: Diagnosis not present

## 2022-06-03 DIAGNOSIS — M170 Bilateral primary osteoarthritis of knee: Secondary | ICD-10-CM | POA: Diagnosis not present

## 2022-06-17 ENCOUNTER — Ambulatory Visit
Admission: RE | Admit: 2022-06-17 | Discharge: 2022-06-17 | Disposition: A | Discharge status: Home / Self Care | Payer: BC Managed Care – PPO | Source: Ambulatory Visit | Attending: Physician Assistant | Admitting: Physician Assistant

## 2022-06-17 DIAGNOSIS — Z1231 Encounter for screening mammogram for malignant neoplasm of breast: Secondary | ICD-10-CM | POA: Diagnosis not present

## 2022-07-07 ENCOUNTER — Other Ambulatory Visit: Payer: Self-pay | Admitting: Orthopedic Surgery

## 2022-07-07 ENCOUNTER — Encounter: Payer: Self-pay | Admitting: Orthopedic Surgery

## 2022-07-07 DIAGNOSIS — Z01818 Encounter for other preprocedural examination: Secondary | ICD-10-CM

## 2022-07-07 NOTE — H&P (Unsigned)
NAME: Janet Walters MRN:   [id number] DOB:   [birth year]     HISTORY AND PHYSICAL  CHIEF COMPLAINT:  right hip pain  HISTORY:   Janet Walters a 61 y.o. female  with right  Hip Pain Patient complains of right hip pain. Onset of the symptoms was several years ago. Inciting event: known DJD. The patient reports the hip pain is worse with weight bearing. Associated symptoms: none. Aggravating symptoms include: any weight bearing. Patient has had no prior hip problems. Previous visits for this problem: yes, last seen several weeks ago by me. Evaluation to date: plain films, which were abnormal  osteoarthritis . Treatment to date: OTC analgesics, which have been somewhat effective, prescription analgesics, which have been somewhat effective, and home exercise program, which has been somewhat effective.    Plan for right total hip replacement  PAST MEDICAL HISTORY:   Past Medical History:  Diagnosis Date   Back pain, chronic    DDD (degenerative disc disease), lumbar    Genital herpes    GERD (gastroesophageal reflux disease)    Hypertension    Insomnia     PAST SURGICAL HISTORY:   Past Surgical History:  Procedure Laterality Date   COLONOSCOPY     COLONOSCOPY WITH PROPOFOL N/A 09/18/2016   Procedure: COLONOSCOPY WITH PROPOFOL;  Surgeon: Manya Silvas, MD;  Location: [place];  Service: Endoscopy;  Laterality: N/A;    MEDICATIONS:  (Not in a hospital admission)   ALLERGIES:  No Known Allergies  REVIEW OF SYSTEMS:   Negative except HPI  FAMILY HISTORY:   Family History  Problem Relation Age of Onset   Breast cancer Paternal Aunt    Breast cancer Paternal Aunt    Breast cancer Paternal Aunt     SOCIAL HISTORY:   reports that she has never smoked. She has never used smokeless tobacco. She reports that she does not drink alcohol and does not use drugs.  PHYSICAL EXAM:  General appearance: alert, cooperative, and no distress Neck: no JVD and supple, symmetrical, trachea  midline Resp: clear to auscultation bilaterally Cardio: regular rate and rhythm, S1, S2 normal, no murmur, click, rub or gallop GI: soft, non-tender; bowel sounds normal; no masses,  no organomegaly Extremities: extremities normal, atraumatic, no cyanosis or edema and Homans sign is negative, no sign of DVT Pulses: 2+ and symmetric Skin: Skin color, texture, turgor normal. No rashes or lesions Neurologic: Alert and oriented X 3, normal strength and tone. Normal symmetric reflexes. Normal coordination and gait    LABORATORY STUDIES: No results for input(s): "WBC", "HGB", "HCT", "PLT" in the last 72 hours.  No results for input(s): "NA", "K", "CL", "CO2", "GLUCOSE", "BUN", "CREATININE", "CALCIUM" in the last 72 hours.  STUDIES/RESULTS:  MM 3D SCREEN BREAST BILATERAL  Result Date: 06/18/2022 CLINICAL DATA:  Screening. EXAM: DIGITAL SCREENING BILATERAL MAMMOGRAM WITH TOMOSYNTHESIS AND CAD TECHNIQUE: Bilateral screening digital craniocaudal and mediolateral oblique mammograms were obtained. Bilateral screening digital breast tomosynthesis was performed. The images were evaluated with computer-aided detection. COMPARISON:  Previous exam(s). ACR Breast Density Category a: The breast tissue is almost entirely fatty. FINDINGS: There are no findings suspicious for malignancy. IMPRESSION: No mammographic evidence of malignancy. A result letter of this screening mammogram will be mailed directly to the patient. RECOMMENDATION: Screening mammogram in one year. (Code:SM-B-01Y) BI-RADS CATEGORY  1: Negative. Electronically Signed   By: Ammie Ferrier M.D.   On: 06/18/2022 16:40    ASSESSMENT:  End stage osteoarthritis right hip  Active Problems:   * No active hospital problems. *    PLAN:  Right Primary Total Hip   Carlynn Spry 07/07/2022. 7:40 AM

## 2022-07-09 ENCOUNTER — Encounter
Admission: RE | Admit: 2022-07-09 | Discharge: 2022-07-09 | Disposition: A | Discharge status: Home / Self Care | Payer: BC Managed Care – PPO | Source: Ambulatory Visit | Attending: Orthopedic Surgery | Admitting: Orthopedic Surgery

## 2022-07-09 VITALS — BP 163/85 | HR 63 | Temp 98.4°F | Resp 16 | Ht 67.0 in | Wt 250.7 lb

## 2022-07-09 DIAGNOSIS — Z01812 Encounter for preprocedural laboratory examination: Secondary | ICD-10-CM

## 2022-07-09 DIAGNOSIS — Z01818 Encounter for other preprocedural examination: Secondary | ICD-10-CM | POA: Diagnosis not present

## 2022-07-09 DIAGNOSIS — I10 Essential (primary) hypertension: Secondary | ICD-10-CM | POA: Diagnosis not present

## 2022-07-09 LAB — SURGICAL PCR SCREEN
MRSA, PCR: NEGATIVE
Staphylococcus aureus: NEGATIVE

## 2022-07-09 LAB — BASIC METABOLIC PANEL
Anion gap: 9 (ref 5–15)
BUN: 15 mg/dL (ref 8–23)
CO2: 26 mmol/L (ref 22–32)
Calcium: 9.3 mg/dL (ref 8.9–10.3)
Chloride: 105 mmol/L (ref 98–111)
Creatinine, Ser: 0.78 mg/dL (ref 0.44–1.00)
GFR, Estimated: >60 mL/min (ref >60)
Glucose, Bld: 120 mg/dL — ABNORMAL HIGH (ref 70–99)
Potassium: 3.5 mmol/L (ref 3.5–5.1)
Sodium: 140 mmol/L (ref 135–145)

## 2022-07-09 LAB — TYPE AND SCREEN
ABO/RH(D): O NEG
Antibody Screen: NEGATIVE

## 2022-07-09 LAB — URINALYSIS, ROUTINE W REFLEX MICROSCOPIC
Bilirubin Urine: NEGATIVE
Glucose, UA: NEGATIVE mg/dL
Hgb urine dipstick: NEGATIVE
Ketones, ur: NEGATIVE mg/dL
Leukocytes,Ua: NEGATIVE
Nitrite: NEGATIVE
Protein, ur: NEGATIVE mg/dL
Specific Gravity, Urine: 1.02 (ref 1.005–1.030)
pH: 8 (ref 5.0–8.0)

## 2022-07-09 LAB — CBC
HCT: 40.5 % (ref 36.0–46.0)
Hemoglobin: 13.1 g/dL (ref 12.0–15.0)
MCH: 26.1 pg (ref 26.0–34.0)
MCHC: 32.3 g/dL (ref 30.0–36.0)
MCV: 80.8 fL (ref 80.0–100.0)
Platelets: 260 10*3/uL (ref 150–400)
RBC: 5.01 MIL/uL (ref 3.87–5.11)
RDW: 12.6 % (ref 11.5–15.5)
WBC: 4.4 10*3/uL (ref 4.0–10.5)
nRBC: 0 % (ref 0.0–0.2)

## 2022-07-09 NOTE — Patient Instructions (Addendum)
Your procedure is scheduled on: Tuesday July 21, 2022. Report to [facility] inside [facility] 2nd floor, stop by registration desk before getting on elevator.  To find out your arrival time please call [phone] between 1PM - 3PM on Monday July 20, 2022.  Remember: Instructions that are not followed completely may result in serious medical risk,  up to and including death, or upon the discretion of your surgeon and anesthesiologist your  surgery may need to be rescheduled.     _X__ 1. Do not eat food or drink fluids after midnight the night before your procedure.                 No chewing gum or hard candies.  __X__2.  On the morning of surgery brush your teeth with toothpaste and water, you                may rinse your mouth with mouthwash if you wish.  Do not swallow any toothpaste or mouthwash.     _X__ 3.  No Alcohol for 24 hours before or after surgery.   _X__ 4.  Do Not Smoke or use e-cigarettes For 24 Hours Prior to Your Surgery.                 Do not use any chewable tobacco products for at least 6 hours prior to                 Surgery.  _X__  5.  Do not use any recreational drugs (marijuana, cocaine, heroin, ecstasy, MDMA or other)                For at least one week prior to your surgery.  Combination of these drugs with anesthesia                May have life threatening results.  ____  6.  Bring all medications with you on the [place] of surgery if instructed.   __X_ 7.  Notify your doctor if there is any change in your medical condition      (cold, fever, infections).     Do not wear jewelry, make-up, hairpins, clips or nail polish. Do not wear lotions, powders, or perfumes. You may wear deodorant. Do not shave 48 hours prior to surgery. Men may shave face and neck. Do not bring valuables to the hospital.    [facility] is not responsible for any belongings or valuables.  Contacts, dentures or bridgework may not be worn into  surgery. Leave your suitcase in the car. After surgery it may be brought to your room. For patients admitted to the hospital, discharge time is determined by your treatment team.   Patients discharged the [place] of surgery will not be allowed to drive home.   Make arrangements for someone to be with you for the first 24 hours of your [place] Discharge.   __X__ Take these medicines the morning of surgery with A SIP OF WATER:    1. famotidine (PEPCID) 20 MG  2.   3.   4.  5.  6.  ____ Fleet Enema (as directed)   __X__ Use CHG Soap (or wipes) as directed  ____ Use Benzoyl Peroxide Gel as instructed  ____ Use inhalers on the [place] of surgery  ____ Stop metformin 2 days prior to surgery    ____ Take 1/2 of usual insulin dose the night before surgery. No insulin the morning  of surgery.   ____ Call your PCP, cardiologist, or Pulmonologist if taking Coumadin/Plavix/aspirin and ask when to stop before your surgery.   __X__ One Week prior to surgery- Stop Anti-inflammatories such as Ibuprofen, Aleve, Advil, Motrin, meloxicam (MOBIC), diclofenac, etodolac, ketorolac, Toradol, Daypro, piroxicam, Goody's or BC powders. OK TO USE TYLENOL IF NEEDED   __X__ Stop supplements until after surgery.    ____ Bring C-Pap to the hospital.    If you have any questions regarding your pre-procedure instructions,  Please call Pre-admit Testing at [phone]

## 2022-07-18 DIAGNOSIS — M1611 Unilateral primary osteoarthritis, right hip: Secondary | ICD-10-CM | POA: Diagnosis not present

## 2022-07-21 ENCOUNTER — Encounter
Admission: RE | Disposition: A | Discharge status: Home / Self Care | Payer: Self-pay | Source: Home / Self Care | Attending: Orthopedic Surgery

## 2022-07-21 ENCOUNTER — Other Ambulatory Visit: Payer: Self-pay

## 2022-07-21 ENCOUNTER — Ambulatory Visit: Payer: BC Managed Care – PPO | Admitting: Urgent Care

## 2022-07-21 ENCOUNTER — Ambulatory Visit: Payer: BC Managed Care – PPO

## 2022-07-21 ENCOUNTER — Ambulatory Visit: Payer: BC Managed Care – PPO | Admitting: Certified Registered"

## 2022-07-21 ENCOUNTER — Observation Stay
Admission: RE | Admit: 2022-07-21 | Discharge: 2022-07-22 | Disposition: A | Discharge status: Home / Self Care | Payer: BC Managed Care – PPO | Attending: Orthopedic Surgery | Admitting: Orthopedic Surgery

## 2022-07-21 ENCOUNTER — Encounter: Payer: Self-pay | Admitting: Orthopedic Surgery

## 2022-07-21 DIAGNOSIS — Z9889 Other specified postprocedural states: Secondary | ICD-10-CM | POA: Diagnosis not present

## 2022-07-21 DIAGNOSIS — M1611 Unilateral primary osteoarthritis, right hip: Secondary | ICD-10-CM | POA: Diagnosis not present

## 2022-07-21 DIAGNOSIS — E669 Obesity, unspecified: Secondary | ICD-10-CM | POA: Diagnosis not present

## 2022-07-21 DIAGNOSIS — Z6838 Body mass index (BMI) 38.0-38.9, adult: Secondary | ICD-10-CM | POA: Diagnosis not present

## 2022-07-21 DIAGNOSIS — I10 Essential (primary) hypertension: Secondary | ICD-10-CM | POA: Insufficient documentation

## 2022-07-21 DIAGNOSIS — Z96641 Presence of right artificial hip joint: Secondary | ICD-10-CM

## 2022-07-21 HISTORY — PX: TOTAL HIP ARTHROPLASTY: SHX124

## 2022-07-21 LAB — ABO/RH: ABO/RH(D): O NEG

## 2022-07-21 SURGERY — ARTHROPLASTY, HIP, TOTAL, ANTERIOR APPROACH
Anesthesia: Spinal | Site: Hip | Laterality: Right

## 2022-07-21 MED ORDER — CEFAZOLIN SODIUM-DEXTROSE 2-4 GM/100ML-% IV SOLN
2.0000 g | INTRAVENOUS | Status: AC
  Administered 2022-07-21: 2 g via INTRAVENOUS

## 2022-07-21 MED ORDER — GABAPENTIN 300 MG PO CAPS
300.0000 mg | ORAL_CAPSULE | Freq: Every day | ORAL | Status: DC
  Administered 2022-07-21: 300 mg via ORAL
  Filled 2022-07-21: qty 1

## 2022-07-21 MED ORDER — KETOROLAC TROMETHAMINE 15 MG/ML IJ SOLN
15.0000 mg | Freq: Four times a day (QID) | INTRAMUSCULAR | Status: AC
  Administered 2022-07-21 – 2022-07-22 (×4): 15 mg via INTRAVENOUS
  Filled 2022-07-21 (×4): qty 1

## 2022-07-21 MED ORDER — FAMOTIDINE 20 MG PO TABS
20.0000 mg | ORAL_TABLET | Freq: Once | ORAL | Status: AC
  Administered 2022-07-21: 20 mg via ORAL

## 2022-07-21 MED ORDER — ONDANSETRON HCL 4 MG PO TABS: 4.0000 mg | ORAL_TABLET | Freq: Four times a day (QID) | ORAL | Status: DC | PRN

## 2022-07-21 MED ORDER — EPHEDRINE 5 MG/ML INJ
INTRAVENOUS | Status: AC
  Filled 2022-07-21: qty 5

## 2022-07-21 MED ORDER — ACETAMINOPHEN 10 MG/ML IV SOLN
INTRAVENOUS | Status: AC
  Filled 2022-07-21: qty 100

## 2022-07-21 MED ORDER — 0.9 % SODIUM CHLORIDE (POUR BTL) OPTIME
TOPICAL | Status: DC | PRN
  Administered 2022-07-21: 1000 mL

## 2022-07-21 MED ORDER — TRANEXAMIC ACID-NACL 1000-0.7 MG/100ML-% IV SOLN
INTRAVENOUS | Status: AC
  Filled 2022-07-21: qty 100

## 2022-07-21 MED ORDER — DEXAMETHASONE SODIUM PHOSPHATE 10 MG/ML IJ SOLN
INTRAMUSCULAR | Status: DC | PRN
  Administered 2022-07-21: 10 mg via INTRAVENOUS

## 2022-07-21 MED ORDER — BUPIVACAINE-MELOXICAM ER 200-6 MG/7ML IJ SOLN
INTRAMUSCULAR | Status: DC | PRN
  Administered 2022-07-21: 7 mL

## 2022-07-21 MED ORDER — ALUM & MAG HYDROXIDE-SIMETH 200-200-20 MG/5ML PO SUSP: 30.0000 mL | ORAL | Status: DC | PRN

## 2022-07-21 MED ORDER — ONDANSETRON HCL 4 MG/2ML IJ SOLN
INTRAMUSCULAR | Status: DC | PRN
  Administered 2022-07-21: 4 mg via INTRAVENOUS

## 2022-07-21 MED ORDER — FAMOTIDINE 20 MG PO TABS
ORAL_TABLET | ORAL | Status: AC
  Filled 2022-07-21: qty 1

## 2022-07-21 MED ORDER — ACETAMINOPHEN 325 MG PO TABS: 325.0000 mg | ORAL_TABLET | Freq: Four times a day (QID) | ORAL | Status: DC | PRN

## 2022-07-21 MED ORDER — PHENOL 1.4 % MT LIQD: 1.0000 | OROMUCOSAL | Status: DC | PRN

## 2022-07-21 MED ORDER — PROPOFOL 10 MG/ML IV BOLUS
INTRAVENOUS | Status: AC
  Filled 2022-07-21: qty 20

## 2022-07-21 MED ORDER — CHLORHEXIDINE GLUCONATE 0.12 % MT SOLN: 15.0000 mL | Freq: Once | OROMUCOSAL | Status: AC

## 2022-07-21 MED ORDER — METOCLOPRAMIDE HCL 5 MG/ML IJ SOLN: 5.0000 mg | Freq: Three times a day (TID) | INTRAMUSCULAR | Status: DC | PRN

## 2022-07-21 MED ORDER — SODIUM CHLORIDE 0.9 % IR SOLN
Status: DC | PRN
  Administered 2022-07-21: 3000 mL

## 2022-07-21 MED ORDER — BUPIVACAINE-MELOXICAM ER 200-6 MG/7ML IJ SOLN
INTRAMUSCULAR | Status: AC
  Filled 2022-07-21: qty 1

## 2022-07-21 MED ORDER — ONDANSETRON HCL 4 MG/2ML IJ SOLN: 4.0000 mg | Freq: Once | INTRAMUSCULAR | Status: DC | PRN

## 2022-07-21 MED ORDER — PHENYLEPHRINE HCL (PRESSORS) 10 MG/ML IV SOLN
INTRAVENOUS | Status: AC
  Filled 2022-07-21: qty 1

## 2022-07-21 MED ORDER — BUPIVACAINE HCL (PF) 0.5 % IJ SOLN
INTRAMUSCULAR | Status: DC | PRN
  Administered 2022-07-21: 2.8 mL via INTRATHECAL

## 2022-07-21 MED ORDER — ACETAMINOPHEN 160 MG/5ML PO SOLN
325.0000 mg | ORAL | Status: DC | PRN
  Filled 2022-07-21: qty 20.3

## 2022-07-21 MED ORDER — TRANEXAMIC ACID-NACL 1000-0.7 MG/100ML-% IV SOLN
1000.0000 mg | INTRAVENOUS | Status: AC
  Administered 2022-07-21: 1000 mg via INTRAVENOUS

## 2022-07-21 MED ORDER — METOCLOPRAMIDE HCL 5 MG PO TABS: 5.0000 mg | ORAL_TABLET | Freq: Three times a day (TID) | ORAL | Status: DC | PRN

## 2022-07-21 MED ORDER — SURGIRINSE WOUND IRRIGATION SYSTEM - OPTIME
TOPICAL | Status: DC | PRN
  Administered 2022-07-21: 450 mL via TOPICAL

## 2022-07-21 MED ORDER — LIDOCAINE HCL (CARDIAC) PF 100 MG/5ML IV SOSY
PREFILLED_SYRINGE | INTRAVENOUS | Status: DC | PRN
  Administered 2022-07-21: 100 mg via INTRAVENOUS

## 2022-07-21 MED ORDER — CEFAZOLIN SODIUM-DEXTROSE 2-4 GM/100ML-% IV SOLN
2.0000 g | Freq: Four times a day (QID) | INTRAVENOUS | Status: AC
  Administered 2022-07-21 (×2): 2 g via INTRAVENOUS
  Filled 2022-07-21 (×2): qty 100

## 2022-07-21 MED ORDER — HYDROCODONE-ACETAMINOPHEN 7.5-325 MG PO TABS
1.0000 | ORAL_TABLET | ORAL | Status: DC | PRN
  Administered 2022-07-21: 2 via ORAL
  Filled 2022-07-21: qty 2

## 2022-07-21 MED ORDER — DOCUSATE SODIUM 100 MG PO CAPS
100.0000 mg | ORAL_CAPSULE | Freq: Two times a day (BID) | ORAL | Status: DC
  Administered 2022-07-21 – 2022-07-22 (×2): 100 mg via ORAL
  Filled 2022-07-21 (×2): qty 1

## 2022-07-21 MED ORDER — MIDAZOLAM HCL 2 MG/2ML IJ SOLN
INTRAMUSCULAR | Status: AC
  Filled 2022-07-21: qty 2

## 2022-07-21 MED ORDER — FENTANYL CITRATE (PF) 100 MCG/2ML IJ SOLN: 25.0000 ug | INTRAMUSCULAR | Status: DC | PRN

## 2022-07-21 MED ORDER — POVIDONE-IODINE 10 % EX SWAB
2.0000 | Freq: Once | CUTANEOUS | Status: AC
  Administered 2022-07-21: 2 via TOPICAL

## 2022-07-21 MED ORDER — HYDROCHLOROTHIAZIDE 25 MG PO TABS
25.0000 mg | ORAL_TABLET | Freq: Every day | ORAL | Status: DC
  Administered 2022-07-22: 25 mg via ORAL
  Filled 2022-07-21: qty 1

## 2022-07-21 MED ORDER — LACTATED RINGERS IV SOLN: INTRAVENOUS | Status: DC

## 2022-07-21 MED ORDER — MENTHOL 3 MG MT LOZG: 1.0000 | LOZENGE | OROMUCOSAL | Status: DC | PRN

## 2022-07-21 MED ORDER — CEFAZOLIN SODIUM-DEXTROSE 2-4 GM/100ML-% IV SOLN
INTRAVENOUS | Status: AC
  Filled 2022-07-21: qty 100

## 2022-07-21 MED ORDER — ORAL CARE MOUTH RINSE: 15.0000 mL | Freq: Once | OROMUCOSAL | Status: AC

## 2022-07-21 MED ORDER — BISACODYL 10 MG RE SUPP: 10.0000 mg | Freq: Every day | RECTAL | Status: DC | PRN

## 2022-07-21 MED ORDER — FAMOTIDINE 20 MG PO TABS
20.0000 mg | ORAL_TABLET | Freq: Two times a day (BID) | ORAL | Status: DC
  Administered 2022-07-21 – 2022-07-22 (×2): 20 mg via ORAL
  Filled 2022-07-21 (×2): qty 1

## 2022-07-21 MED ORDER — CHLORHEXIDINE GLUCONATE 0.12 % MT SOLN
OROMUCOSAL | Status: AC
  Administered 2022-07-21: 15 mL via OROMUCOSAL
  Filled 2022-07-21: qty 15

## 2022-07-21 MED ORDER — MAGNESIUM CITRATE PO SOLN: 1.0000 | Freq: Once | ORAL | Status: DC | PRN

## 2022-07-21 MED ORDER — BUPIVACAINE-EPINEPHRINE (PF) 0.25% -1:200000 IJ SOLN
INTRAMUSCULAR | Status: DC | PRN
  Administered 2022-07-21: 30 mL

## 2022-07-21 MED ORDER — LIDOCAINE HCL (PF) 2 % IJ SOLN
INTRAMUSCULAR | Status: AC
  Filled 2022-07-21: qty 5

## 2022-07-21 MED ORDER — LISINOPRIL 20 MG PO TABS
20.0000 mg | ORAL_TABLET | Freq: Every day | ORAL | Status: DC
  Administered 2022-07-22: 20 mg via ORAL
  Filled 2022-07-21: qty 1

## 2022-07-21 MED ORDER — PHENYLEPHRINE HCL-NACL 20-0.9 MG/250ML-% IV SOLN
INTRAVENOUS | Status: DC | PRN
  Administered 2022-07-21: 40 ug/min via INTRAVENOUS

## 2022-07-21 MED ORDER — ONDANSETRON HCL 4 MG/2ML IJ SOLN: 4.0000 mg | Freq: Four times a day (QID) | INTRAMUSCULAR | Status: DC | PRN

## 2022-07-21 MED ORDER — ACETAMINOPHEN 10 MG/ML IV SOLN
INTRAVENOUS | Status: DC | PRN
  Administered 2022-07-21: 1000 mg via INTRAVENOUS

## 2022-07-21 MED ORDER — DEXAMETHASONE SODIUM PHOSPHATE 10 MG/ML IJ SOLN
INTRAMUSCULAR | Status: AC
  Filled 2022-07-21: qty 1

## 2022-07-21 MED ORDER — HYDROCODONE-ACETAMINOPHEN 7.5-325 MG PO TABS
1.0000 | ORAL_TABLET | Freq: Once | ORAL | Status: DC | PRN
  Filled 2022-07-21: qty 1

## 2022-07-21 MED ORDER — LISINOPRIL-HYDROCHLOROTHIAZIDE 20-25 MG PO TABS: 1.0000 | ORAL_TABLET | Freq: Every day | ORAL | Status: DC

## 2022-07-21 MED ORDER — MIDAZOLAM HCL 5 MG/5ML IJ SOLN
INTRAMUSCULAR | Status: DC | PRN
  Administered 2022-07-21: 2 mg via INTRAVENOUS

## 2022-07-21 MED ORDER — ONDANSETRON HCL 4 MG/2ML IJ SOLN
INTRAMUSCULAR | Status: AC
  Filled 2022-07-21: qty 2

## 2022-07-21 MED ORDER — ACETAMINOPHEN 325 MG PO TABS: 325.0000 mg | ORAL_TABLET | ORAL | Status: DC | PRN

## 2022-07-21 MED ORDER — HYDROCODONE-ACETAMINOPHEN 5-325 MG PO TABS: 1.0000 | ORAL_TABLET | ORAL | Status: DC | PRN

## 2022-07-21 MED ORDER — PROPOFOL 10 MG/ML IV BOLUS
INTRAVENOUS | Status: DC | PRN
  Administered 2022-07-21: 80 ug/kg/min via INTRAVENOUS
  Administered 2022-07-21: 40 mg via INTRAVENOUS

## 2022-07-21 MED ORDER — EPHEDRINE SULFATE (PRESSORS) 50 MG/ML IJ SOLN
INTRAMUSCULAR | Status: DC | PRN
  Administered 2022-07-21: 10 mg via INTRAVENOUS

## 2022-07-21 MED ORDER — MORPHINE SULFATE (PF) 2 MG/ML IV SOLN: 0.5000 mg | INTRAVENOUS | Status: DC | PRN

## 2022-07-21 MED ORDER — ASPIRIN 81 MG PO CHEW
81.0000 mg | CHEWABLE_TABLET | Freq: Two times a day (BID) | ORAL | Status: DC
  Administered 2022-07-21 – 2022-07-22 (×2): 81 mg via ORAL
  Filled 2022-07-21 (×2): qty 1

## 2022-07-21 MED ORDER — MAGNESIUM HYDROXIDE 400 MG/5ML PO SUSP: 30.0000 mL | Freq: Every day | ORAL | Status: DC | PRN

## 2022-07-21 MED ORDER — BUPIVACAINE-EPINEPHRINE (PF) 0.25% -1:200000 IJ SOLN
INTRAMUSCULAR | Status: AC
  Filled 2022-07-21: qty 30

## 2022-07-21 SURGICAL SUPPLY — 54 items
BLADE SAGITTAL WIDE XTHICK NO (BLADE) ×1 IMPLANT
BNDG COHESIVE 4X5 TAN STRL LF (GAUZE/BANDAGES/DRESSINGS) IMPLANT
BRUSH SCRUB EZ  4% CHG (MISCELLANEOUS) ×1
BRUSH SCRUB EZ 4% CHG (MISCELLANEOUS) ×1 IMPLANT
CHLORAPREP W/TINT 26 (MISCELLANEOUS) ×2 IMPLANT
COVER HOLE (Hips) IMPLANT
CUP R3 50MM (Hips) IMPLANT
DRAPE 3/4 80X56 (DRAPES) ×1 IMPLANT
DRAPE C-ARM 42X72 X-RAY (DRAPES) ×1 IMPLANT
DRAPE STERI IOBAN 125X83 (DRAPES) IMPLANT
DRAPE U-SHAPE 47X51 STRL (DRAPES) ×1 IMPLANT
DRSG AQUACEL AG ADV 3.5X10 (GAUZE/BANDAGES/DRESSINGS) IMPLANT
DRSG AQUACEL AG ADV 3.5X14 (GAUZE/BANDAGES/DRESSINGS) IMPLANT
ELECT REM PT RETURN 9FT ADLT (ELECTROSURGICAL) ×1
ELECTRODE REM PT RTRN 9FT ADLT (ELECTROSURGICAL) ×1 IMPLANT
GAUZE 4X4 16PLY ~~LOC~~+RFID DBL (SPONGE) ×1 IMPLANT
GAUZE XEROFORM 1X8 LF (GAUZE/BANDAGES/DRESSINGS) IMPLANT
GLOVE BIO SURGEON STRL SZ8 (GLOVE) ×1 IMPLANT
GLOVE BIOGEL PI IND STRL 8.5 (GLOVE) ×2 IMPLANT
GLOVE PI ORTHO PRO STRL SZ8 (GLOVE) ×2 IMPLANT
GOWN STRL REUS W/ TWL XL LVL3 (GOWN DISPOSABLE) ×2 IMPLANT
GOWN STRL REUS W/TWL XL LVL3 (GOWN DISPOSABLE) ×2
HEAD OXINIUM PLUS 0 32MM (Hips) IMPLANT
HOOD PEEL AWAY FLYTE STAYCOOL (MISCELLANEOUS) ×3 IMPLANT
IV NS 250ML (IV SOLUTION) ×1
IV NS 250ML BAXH (IV SOLUTION) IMPLANT
IV NS IRRIG 3000ML ARTHROMATIC (IV SOLUTION) ×1 IMPLANT
KIT PATIENT CARE HANA TABLE (KITS) ×1 IMPLANT
KIT TURNOVER CYSTO (KITS) ×1 IMPLANT
LINER 0 DEG 32X50MM (Hips) IMPLANT
MANIFOLD NEPTUNE II (INSTRUMENTS) ×1 IMPLANT
MAT ABSORB  FLUID 56X50 GRAY (MISCELLANEOUS) ×1
MAT ABSORB FLUID 56X50 GRAY (MISCELLANEOUS) ×1 IMPLANT
NDL SAFETY ECLIP 18X1.5 (MISCELLANEOUS) IMPLANT
NDL SPNL 20GX3.5 QUINCKE YW (NEEDLE) ×1 IMPLANT
NEEDLE SPNL 20GX3.5 QUINCKE YW (NEEDLE) ×1 IMPLANT
PACK HIP PROSTHESIS (MISCELLANEOUS) ×1 IMPLANT
PADDING CAST BLEND 4X4 NS (MISCELLANEOUS) ×2 IMPLANT
PILLOW ABDUCTION MEDIUM (MISCELLANEOUS) ×1 IMPLANT
PULSAVAC PLUS IRRIG FAN TIP (DISPOSABLE) ×1
SCREW 6.5X25MM (Screw) IMPLANT
SOLUTION IRRIG SURGIPHOR (IV SOLUTION) ×1 IMPLANT
SPONGE T-LAP 18X18 ~~LOC~~+RFID (SPONGE) ×2 IMPLANT
STAPLER SKIN PROX 35W (STAPLE) ×1 IMPLANT
STEM LATERAL COLLAR LAT1 12/14 (Stem) IMPLANT
SUT BONE WAX W31G (SUTURE) ×1 IMPLANT
SUT DVC 2 QUILL PDO  T11 36X36 (SUTURE) ×1
SUT DVC 2 QUILL PDO T11 36X36 (SUTURE) ×1 IMPLANT
SUT VIC AB 2-0 CT1 18 (SUTURE) ×1 IMPLANT
SYR 30ML LL (SYRINGE) ×1 IMPLANT
TIP FAN IRRIG PULSAVAC PLUS (DISPOSABLE) ×1 IMPLANT
TRAP FLUID SMOKE EVACUATOR (MISCELLANEOUS) ×1 IMPLANT
WAND WEREWOLF FASTSEAL 6.0 (MISCELLANEOUS) ×1 IMPLANT
WATER STERILE IRR 500ML POUR (IV SOLUTION) ×1 IMPLANT

## 2022-07-21 NOTE — Anesthesia Preprocedure Evaluation (Addendum)
Anesthesia Evaluation  Patient identified by MRN, date of birth, ID band Patient awake    Reviewed: Allergy & Precautions, NPO status , Patient's Chart, lab work & pertinent test results  Airway Mallampati: III  TM Distance: >3 FB Neck ROM: full    Dental  (+) Chipped, Missing, Partial Upper   Pulmonary neg pulmonary ROS,    Pulmonary exam normal        Cardiovascular hypertension, negative cardio ROS Normal cardiovascular exam     Neuro/Psych negative neurological ROS  negative psych ROS   GI/Hepatic negative GI ROS, Neg liver ROS, GERD  Controlled,  Endo/Other  Morbid obesity  Renal/GU      Musculoskeletal  (+) Arthritis ,   Abdominal   Peds  Hematology negative hematology ROS (+)   Anesthesia Other Findings Past Medical History: No date: Back pain, chronic No date: DDD (degenerative disc disease), lumbar No date: Genital herpes No date: GERD (gastroesophageal reflux disease) No date: Hypertension No date: Insomnia  Past Surgical History: No date: COLONOSCOPY 09/18/2016: COLONOSCOPY WITH PROPOFOL; N/A     Comment:  Procedure: COLONOSCOPY WITH PROPOFOL;  Surgeon: Manya Silvas, MD;  Location: [facility] ENDOSCOPY;  Service:               Endoscopy;  Laterality: N/A;  BMI    Body Mass Index: 38.53 kg/m      Reproductive/Obstetrics negative OB ROS                            Anesthesia Physical Anesthesia Plan  ASA: 3  Anesthesia Plan: Spinal   Post-op Pain Management:    Induction:   PONV Risk Score and Plan:   Airway Management Planned: Natural Airway and Nasal Cannula  Additional Equipment:   Intra-op Plan:   Post-operative Plan:   Informed Consent: I have reviewed the patients History and Physical, chart, labs and discussed the procedure including the risks, benefits and alternatives for the proposed anesthesia with the patient or authorized  representative who has indicated his/her understanding and acceptance.     Dental Advisory Given  Plan Discussed with: Anesthesiologist, CRNA and Surgeon  Anesthesia Plan Comments: (Patient reports no bleeding problems and no anticoagulant use.  Plan for spinal with backup GA  Patient consented for risks of anesthesia including but not limited to:  - adverse reactions to medications - damage to eyes, teeth, lips or other oral mucosa - nerve damage due to positioning  - risk of bleeding, infection and or nerve damage from spinal that could lead to paralysis - risk of headache or failed spinal - damage to teeth, lips or other oral mucosa - sore throat or hoarseness - damage to heart, brain, nerves, lungs, other parts of body or loss of life  Patient voiced understanding.)       Anesthesia Quick Evaluation

## 2022-07-21 NOTE — Plan of Care (Signed)

## 2022-07-21 NOTE — Evaluation (Signed)
Physical Therapy Evaluation Patient Details Name: Janet Walters MRN: [id number] DOB: 18-Jan-1961 Today's Date: 07/21/2022  History of Present Illness  Pt admitted for R THR with anterior approach. History includes HTN, obesity, and GERD. Pt is POD 0 at time of evaluation.  Clinical Impression  Limited evaluation performed this date as pt very lethargic due to medications. Falls asleep during evaluation, however willing to try there-ex. Not safe to attempt OOB mobility this date, however demonstrates sufficient strength and appears capable when more alert. Encouraged nursing staff to dangle at bedside once more awake. Pt is a pleasant 61 year old female who was admitted for R THR. Pt demonstrates deficits with pain/mobility/strength. Would benefit from skilled PT to address above deficits and promote optimal return to PLOF. Recommend transition to [place] upon discharge from acute hospitalization.      Recommendations for follow up therapy are one component of a multi-disciplinary discharge planning process, led by the attending physician.  Recommendations may be updated based on patient status, additional functional criteria and insurance authorization.  Follow Up Recommendations Home health PT      Assistance Recommended at Discharge Intermittent Supervision/Assistance  Patient can return home with the following       Equipment Recommendations  (TBD)  Recommendations for Other Services       Functional Status Assessment Patient has had a recent decline in their functional status and demonstrates the ability to make significant improvements in function in a reasonable and predictable amount of time.     Precautions / Restrictions Precautions Precautions: Fall;Anterior Hip Precaution Booklet Issued: No Restrictions Weight Bearing Restrictions: Yes RLE Weight Bearing: Weight bearing as tolerated      Mobility  Bed Mobility               General bed mobility comments: NT due to  pt lethargic    Transfers                        Ambulation/Gait                  Stairs            Wheelchair Mobility    Modified Rankin (Stroke Patients Only)       Balance                                             Pertinent Vitals/Pain Pain Assessment Pain Assessment: No/denies pain    Home Living Family/patient expects to be discharged to:: Private residence Living Arrangements: Alone Available Help at Discharge: Family;Available PRN/intermittently (brother is planning to be available) Type of Home: House Home Access: Stairs to enter Entrance Stairs-Rails: Can reach both Entrance Stairs-Number of Steps: 4   Home Layout: One level        Prior Function Prior Level of Function : Independent/Modified Independent             Mobility Comments: very indep prior, and reports no falls ADLs Comments: indep     Hand Dominance        Extremity/Trunk Assessment        Lower Extremity Assessment Lower Extremity Assessment: Generalized weakness (R LE grossly 2/5; L LE grossly 3/5)       Communication   Communication: No difficulties  Cognition Arousal/Alertness: Suspect due to medications Behavior During Therapy: [facility] for tasks assessed/performed  Overall Cognitive Status: Within Functional Limits for tasks assessed                                 General Comments: very drowsy, unable to keep eyes open. Not safe to perform OOB mobility this date        General Comments      Exercises Other Exercises Other Exercises: supine ther-ex performed on R LE including AP, QS, and hip abd/add. 10 reps and pt educated on HEP and frequency and duration.   Assessment/Plan    PT Assessment Patient needs continued PT services  PT Problem List Decreased strength;Decreased balance;Decreased mobility;Pain       PT Treatment Interventions DME instruction;Gait training;Therapeutic exercise    PT Goals  (Current goals can be found in the Care Plan section)  Acute Rehab PT Goals Patient Stated Goal: to go home PT Goal Formulation: With patient Time For Goal Achievement: 08/04/22 Potential to Achieve Goals: Good    Frequency BID     Co-evaluation               AM-PAC PT "6 Clicks" Mobility  Outcome Measure                  End of Session   Activity Tolerance: Patient limited by lethargy Patient left: in bed;with bed alarm set;with SCD's reapplied Nurse Communication: Mobility status PT Visit Diagnosis: Pain;Difficulty in walking, not elsewhere classified (R26.2) Pain - Right/Left: Right Pain - part of body: Hip    Time: 3664-4034 PT Time Calculation (min) (ACUTE ONLY): 10 min   Charges:   PT Evaluation $PT Eval Low Complexity: 1 Low          Greggory Stallion, PT, DPT, GCS [phone]   Manon Banbury 07/21/2022, 4:53 PM

## 2022-07-21 NOTE — H&P (Signed)
The patient has been re-examined, and the chart reviewed, and there have been no interval changes to the documented history and physical.  Plan a right total hip today.  Anesthesia is not consulted regarding a peripheral nerve block for post-operative pain.  The risks, benefits, and alternatives have been discussed at length, and the patient is willing to proceed.    

## 2022-07-21 NOTE — Anesthesia Postprocedure Evaluation (Signed)
Anesthesia Post Note  Patient: Janet Walters Force  Procedure(s) Performed: TOTAL HIP ARTHROPLASTY ANTERIOR APPROACH (Right: Hip)  Patient location during evaluation: PACU Anesthesia Type: Spinal Level of consciousness: awake and alert Pain control: Pain under management. Vital Signs Assessment: post-procedure vital signs reviewed and stable Respiratory status: spontaneous breathing and respiratory function stable Cardiovascular status: blood pressure returned to baseline and stable Postop Assessment: spinal receding Anesthetic complications: no   No notable events documented.   Last Vitals:  Vitals:   07/21/22 1300 07/21/22 1315  BP: 113/73 113/75  Pulse: 63 62  Resp:  15  Temp:  36.6 C  SpO2: 100% 100%    Last Pain:  Vitals:   07/21/22 1427  TempSrc:   PainSc: 8                  Cigi Bega T Lavone Neri

## 2022-07-21 NOTE — Transfer of Care (Signed)
Immediate Anesthesia Transfer of Care Note  Patient: Janet Walters  Procedure(s) Performed: TOTAL HIP ARTHROPLASTY ANTERIOR APPROACH (Right: Hip)  Patient Location: PACU  Anesthesia Type:Spinal  Level of Consciousness: awake, alert  and oriented  Airway & Oxygen Therapy: Patient Spontanous Breathing and Patient connected to face mask oxygen  Post-op Assessment: Report given to RN and Post -op Vital signs reviewed and stable  Post vital signs: Reviewed and stable  Last Vitals:  Vitals Value Taken Time  BP 103/67 07/21/22 1200  Temp 36.3 C 07/21/22 1200  Pulse 77 07/21/22 1204  Resp 19 07/21/22 1204  SpO2 100 % 07/21/22 1204  Vitals shown include unvalidated device data.  Last Pain:  Vitals:   07/21/22 1200  TempSrc:   PainSc: Asleep         Complications: No notable events documented.

## 2022-07-21 NOTE — Op Note (Signed)
07/21/2022  11:53 AM  PATIENT:  Janet Walters   MRN: [id number]  PRE-OPERATIVE DIAGNOSIS:  Osteoarthritis right hip   POST-OPERATIVE DIAGNOSIS: Same  Procedure: Right Total Hip Replacement  Surgeon: Elyn Aquas. Harlow Mares, MD   Assist: Carlynn Spry, PA-C  Anesthesia: Spinal   EBL: 200 mL   Specimens: None   Drains: None   Components used: A size 1 lateral Polarstem Smith [employer]hew, R3 size 50 mm shell, and a 32 mm +0 mm head    Description of the procedure in detail: After informed consent was obtained and the appropriate extremity marked in the pre-operative holding area, the patient was taken to the operating room and placed in the supine position on the fracture table. All pressure points were well padded and bilateral lower extremities were place in traction spars. The hip was prepped and draped in standard sterile fashion. A spinal anesthetic had been delivered by the anesthesia team. The skin and subcutaneous tissues were injected with a mixture of Marcaine with epinephrine for post-operative pain. A longitudinal incision approximately 10 cm in length was carried out from the anterior superior iliac spine to the greater trochanter. The tensor fascia was divided and blunt dissection was taken down to the level of the joint capsule. The lateral circumflex vessels were cauterized. Deep retractors were placed and a portion of the anterior capsule was excised. Using fluoroscopy the neck cut was planned and carried out with a sagittal saw. The head was passed from the field with use of a corkscrew and hip skid. Deep retractors were placed along the acetabulum and the degenerative labrum and large osteophytes were removed with a Rongeur. The cup was sequentially reamed to a size 50 mm. The wound was irrigated and using fluoroscopy the size 50 mm cup was impacted in to anatomic position. A single screw was placed followed by a threaded hole cover. The final liner was impacted in to position.  Attention was then turned to the proximal femur. The leg was placed in extension and external rotation. The canal was opened and sequentially broached to a size 1 lateral. The trial components were placed and the hip relocated. The components were found to be in good position using fluoroscopy. The hip was dislocated and the trial components removed. The final components were impacted in to position and the hip relocated. The final components were again check with fluoroscopy and found to be in good position. Hemostasis was achieved with electrocautery. The deep capsule was injected with Marcaine and epinephrine. The wound was irrigated with bacitracin laced normal saline and the tensor fascia closed with #2 Quill suture. The subcutaneous tissues were closed with 2-0 vicryl and staples for the skin. A sterile dressing was applied and an abduction pillow. Patient tolerated the procedure well and there were no apparent complication. Patient was taken to the recovery room in good condition.   Kurtis Bushman, MD

## 2022-07-21 NOTE — Anesthesia Procedure Notes (Signed)
Spinal  Patient location during procedure: OR Start time: 07/21/2022 10:04 AM End time: 07/21/2022 10:11 AM Reason for block: surgical anesthesia Staffing Resident/CRNA: Cammie Sickle, CRNA Performed by: Cammie Sickle, CRNA Authorized by: Alphonsus Sias, MD   Preanesthetic Checklist Completed: patient identified, IV checked, site marked, risks and benefits discussed, surgical consent, monitors and equipment checked, pre-op evaluation and timeout performed Spinal Block Patient position: sitting Prep: ChloraPrep Patient monitoring: heart rate, continuous pulse ox and blood pressure Approach: midline Location: L3-4 Injection technique: single-shot Needle Needle type: Introducer and Pencan  Needle gauge: 24 G Needle length: 10 cm Assessment Sensory level: T6 Events: CSF return Additional Notes Negative paresthesia. Negative blood return. Positive free-flowing CSF. Expiration date of kit checked and confirmed. Patient tolerated procedure well, without complications. Successful on attempt x1, pt. Tolerated procedure well, no complications noted.

## 2022-07-22 ENCOUNTER — Encounter: Payer: Self-pay | Admitting: Orthopedic Surgery

## 2022-07-22 DIAGNOSIS — I10 Essential (primary) hypertension: Secondary | ICD-10-CM | POA: Diagnosis not present

## 2022-07-22 DIAGNOSIS — M1611 Unilateral primary osteoarthritis, right hip: Secondary | ICD-10-CM | POA: Diagnosis not present

## 2022-07-22 MED ORDER — DOCUSATE SODIUM 100 MG PO CAPS: 100.0000 mg | ORAL_CAPSULE | Freq: Two times a day (BID) | ORAL | 0 refills | Status: DC

## 2022-07-22 MED ORDER — ASPIRIN 81 MG PO CHEW: 81.0000 mg | CHEWABLE_TABLET | Freq: Two times a day (BID) | ORAL | 0 refills | Status: DC

## 2022-07-22 MED ORDER — HYDROCODONE-ACETAMINOPHEN 5-325 MG PO TABS: 1.0000 | ORAL_TABLET | ORAL | 0 refills | Status: DC | PRN

## 2022-07-22 MED ORDER — METHOCARBAMOL 750 MG PO TABS: 750.0000 mg | ORAL_TABLET | Freq: Three times a day (TID) | ORAL | 1 refills | Status: AC | PRN

## 2022-07-22 NOTE — Progress Notes (Signed)
Physical Therapy Treatment Patient Details Name: Janet Walters MRN: [id number] DOB: June 02, 1961 Today's Date: 07/22/2022   History of Present Illness Pt admitted for R THR with anterior approach. History includes HTN, obesity, and GERD. Pt is POD 0 at time of evaluation.    PT Comments    Pt is making great progress towards goals and is safe to dc home this date. Pt able to perform bed mobility/transfers safety and perform stair training with cga. RW used and discussed car transfers. HEP given and reviewed. Reports readiness for dc. Care team updated.  Recommendations for follow up therapy are one component of a multi-disciplinary discharge planning process, led by the attending physician.  Recommendations may be updated based on patient status, additional functional criteria and insurance authorization.  Follow Up Recommendations  Home health PT     Assistance Recommended at Discharge PRN  Patient can return home with the following Help with stairs or ramp for entrance   Equipment Recommendations  BSC/3in1    Recommendations for Other Services       Precautions / Restrictions Precautions Precautions: Fall;Anterior Hip Precaution Booklet Issued: Yes (comment) Restrictions Weight Bearing Restrictions: Yes RLE Weight Bearing: Weight bearing as tolerated     Mobility  Bed Mobility Overal bed mobility: Modified Independent             General bed mobility comments: safe technique with upright posture    Transfers Overall transfer level: Needs assistance Equipment used: Rolling walker (2 wheels) Transfers: Sit to/from Stand Sit to Stand: Min guard           General transfer comment: safe technique with cues for hand placement. RW used    Ambulation/Gait Ambulation/Gait assistance: Counsellor (Feet): 250 Feet Assistive device: Rolling walker (2 wheels) Gait Pattern/deviations: Step-through pattern       General Gait Details: ambulated around RN  station with reciprocal gait pattern and upright posture. Safe technique   Stairs Stairs: Yes Stairs assistance: Min guard Stair Management: Two rails, Step to pattern Number of Stairs: 4 General stair comments: up/down with safe technique   Wheelchair Mobility    Modified Rankin (Stroke Patients Only)       Balance Overall balance assessment: Needs assistance Sitting-balance support: Feet supported Sitting balance-Leahy Scale: Good     Standing balance support: Bilateral upper extremity supported Standing balance-Leahy Scale: Good                              Cognition Arousal/Alertness: Awake/alert Behavior During Therapy: WFL for tasks assessed/performed Overall Cognitive Status: Within Functional Limits for tasks assessed                                 General Comments: very pleasant and agreeable to session        Exercises Other Exercises Other Exercises: HEP given and reviewed. Performed 12 reps of AP, glut sets, quad sets, SLRs, hip ab/ad, hip add squeezes, SAQ, LAQ, and alt marching. Safe technique with occasional min assist for performance    General Comments        Pertinent Vitals/Pain Pain Assessment Pain Assessment: 0-10 Pain Score: 2  Pain Location: ant hip Pain Descriptors / Indicators: Operative site guarding Pain Intervention(s): Limited activity within patient's tolerance, Premedicated before session    Home Living  Prior Function            PT Goals (current goals can now be found in the care plan section) Acute Rehab PT Goals Patient Stated Goal: to go home PT Goal Formulation: With patient Time For Goal Achievement: 08/04/22 Potential to Achieve Goals: Good Progress towards PT goals: Progressing toward goals    Frequency    BID      PT Plan Current plan remains appropriate    Co-evaluation              AM-PAC PT "6 Clicks" Mobility   Outcome  Measure  Help needed turning from your back to your side while in a flat bed without using bedrails?: None Help needed moving from lying on your back to sitting on the side of a flat bed without using bedrails?: None Help needed moving to and from a bed to a chair (including a wheelchair)?: A Little Help needed standing up from a chair using your arms (e.g., wheelchair or bedside chair)?: A Little Help needed to walk in hospital room?: A Little Help needed climbing 3-5 steps with a railing? : A Little 6 Click Score: 20    End of Session Equipment Utilized During Treatment: Gait belt Activity Tolerance: Patient tolerated treatment well Patient left: in chair Nurse Communication: Mobility status PT Visit Diagnosis: Pain;Difficulty in walking, not elsewhere classified (R26.2) Pain - Right/Left: Right Pain - part of body: Hip     Time: 0920-1001 PT Time Calculation (min) (ACUTE ONLY): 41 min  Charges:  $Gait Training: 23-37 mins $Therapeutic Exercise: 8-22 mins                     Greggory Stallion, PT, DPT, GCS [phone]    Lacee Grey 07/22/2022, 11:12 AM

## 2022-07-22 NOTE — Progress Notes (Signed)
Met with the patient in the room She ives alone but her brother will be helping her She needs a 3 in 1 but has a rolling walker adpat to deliver the 3 in 1

## 2022-07-22 NOTE — Progress Notes (Signed)
Patient is not able to walk the distance required to go the bathroom, or he/she is unable to safely negotiate stairs required to access the bathroom.  A 3in1 BSC will alleviate this problem  

## 2022-07-22 NOTE — Progress Notes (Signed)
  Subjective:  Patient reports pain as mild to moderate.    Objective:   VITALS:   Vitals:   07/21/22 2038 07/22/22 0048 07/22/22 0449 07/22/22 0757  BP: 135/79 121/68 118/64 124/69  Pulse: 80 67 69 69  Resp: _0 Temp: (!) 97.5 F (36.4 C) 97.7 F (36.5 C) 98 F (36.7 C) 98.1 F (36.7 C)  TempSrc: Oral     SpO2: 99% 99% 100% 99%  Weight:      Height:        PHYSICAL EXAM:  Neurologically intact ABD soft Neurovascular intact Sensation intact distally Intact pulses distally Dorsiflexion/Plantar flexion intact Incision: dressing C/D/I No cellulitis present Compartment soft  LABS  Results for orders placed or performed during the hospital encounter of 07/21/22 (from the past 24 hour(s))  ABO/Rh     Status: None   Collection Time: 07/21/22  8:45 AM  Result Value Ref Range   ABO/RH(D)      Jenetta Downer NEG Performed at [facility], [place], [place] [address]     DG HIP UNILAT WITH PELVIS 1V RIGHT  Result Date: 07/21/2022 CLINICAL DATA:  Right shoulder arthroplasty intraoperative fluoroscopy. EXAM: DG HIP (WITH OR WITHOUT PELVIS) 1V RIGHT COMPARISON:  Pelvis and right hip radiographs 01/23/2015 FINDINGS: Images were performed intraoperatively without the presence of a radiologist. Interval total right hip arthroplasty. No hardware complication is seen. Total fluoroscopy images: 2 Total fluoroscopy time: 8 seconds Total dose: Radiation Exposure Index (as provided by the fluoroscopic device): 1.555 mGy air Kerma Please see intraoperative findings for further detail. IMPRESSION: Intraoperative fluoroscopy for total right hip arthroplasty. Electronically Signed   By: Yvonne Kendall M.D.   On: 07/21/2022 12:01   DG C-Arm 1-60 Min-No Report  Result Date: 07/21/2022 Fluoroscopy was utilized by the requesting physician.  No radiographic interpretation.    Assessment/Plan: 1 Sou Post-Op   Principal Problem:   History of total hip replacement,  right   Advance diet Up with therapy D/C home today if PT goals met   Carlynn Spry , PA-C 07/22/2022, 8:07 AM

## 2022-07-22 NOTE — Plan of Care (Signed)

## 2022-07-22 NOTE — Discharge Instructions (Signed)
No tight elastic waist bands over the bandage. Not wearing underwear is preferred, or pull waist band above the bandage.  May shower with bandage in place.  If bandage becomes saturated, OK to removal and place band-aid.  You may be up walking around as tolerated but should take periodic breaks to elevate your legs.    Pain medication can cause constipation.  You should increase your fluid intake, increase your intake of high fiber foods and/or take Metamucil as needed for constipation.  You may shower.  You do NOT need to cover the dressing or incision site with plastic wrap.  The dressing or incision can get wet, but do not submerge under water.  After your staples have been removed, you should wait 48 hours before submerging incision under water.  Continue your physical therapy exercises at least twice daily.  It is a good idea to use an ice pack for 30 minutes after doing your exercises to reduce swelling.  Do not be surprised if you have increased pain at night.  This usually means you have been a little too active during the [place] and need to reduce your activities.  If you develop lower extremity swelling that does not improve after a night of elevation, please call the office.  This could be an early sign of a blood clot.  Call with questions, fever>101.5 degrees, shortness of breath or drainage from the wound  [phone]  

## 2022-07-22 NOTE — Discharge Summary (Signed)
Physician Discharge Summary  Patient ID: Janet Walters MRN: [id number] DOB/AGE: May 20, 1961 61 y.o.  Admit date: 07/21/2022 Discharge date: 07/22/2022  Admission Diagnoses:  M16.11 Unilateral primary osteoarthritis, right hip History of total hip replacement, right  Discharge Diagnoses:  M16.11 Unilateral primary osteoarthritis, right hip Principal Problem:   History of total hip replacement, right   Past Medical History:  Diagnosis Date   Back pain, chronic    DDD (degenerative disc disease), lumbar    Genital herpes    GERD (gastroesophageal reflux disease)    Hypertension    Insomnia     Surgeries: Procedure(s): TOTAL HIP ARTHROPLASTY ANTERIOR APPROACH on 07/21/2022   Consultants (if any):   Discharged Condition: Improved  Hospital Course: Janet Walters is an 61 y.o. female who was admitted 07/21/2022 with a diagnosis of  M16.11 Unilateral primary osteoarthritis, right hip History of total hip replacement, right and went to the operating room on 07/21/2022 and underwent the above named procedures.    She was given perioperative antibiotics:  Anti-infectives (From admission, onward)    Start     Dose/Rate Route Frequency Ordered Stop   07/21/22 1600  ceFAZolin (ANCEF) IVPB 2g/100 mL premix        2 g 200 mL/hr over 30 Minutes Intravenous Every 6 hours 07/21/22 1344 07/21/22 2135   07/21/22 0845  ceFAZolin (ANCEF) IVPB 2g/100 mL premix        2 g 200 mL/hr over 30 Minutes Intravenous On call to O.R. 07/21/22 0837 07/21/22 1031   07/21/22 0838  ceFAZolin (ANCEF) 2-4 GM/100ML-% IVPB       Note to Pharmacy: Jordan Hawks H: cabinet override      07/21/22 0838 07/21/22 1013     .  She was given sequential compression devices, early ambulation, and Aspirin 81 mg twice daily for 30 days for DVT prophylaxis.  She benefited maximally from the hospital stay and there were no complications.    Recent vital signs:  Vitals:   07/22/22 0449 07/22/22 0757  BP: 118/64  124/69  Pulse: 69 69  Resp: 16 16  Temp: 98 F (36.7 C) 98.1 F (36.7 C)  SpO2: 100% 99%    Recent laboratory studies:  Lab Results  Component Value Date   HGB 13.1 07/09/2022   HGB 13.0 02/11/2021   HGB 14.3 04/17/2018   Lab Results  Component Value Date   WBC 4.4 07/09/2022   PLT 260 07/09/2022   No results found for: "INR" Lab Results  Component Value Date   NA 140 07/09/2022   K 3.5 07/09/2022   CL 105 07/09/2022   CO2 26 07/09/2022   BUN 15 07/09/2022   CREATININE 0.78 07/09/2022   GLUCOSE 120 (H) 07/09/2022    Discharge Medications:   Allergies as of 07/22/2022   No Known Allergies      Medication List     STOP taking these medications    meloxicam 15 MG tablet Commonly known as: MOBIC   naproxen sodium 220 MG tablet Commonly known as: ALEVE       TAKE these medications    aspirin 81 MG chewable tablet Chew 1 tablet (81 mg total) by mouth 2 (two) times daily.   docusate sodium 100 MG capsule Commonly known as: COLACE Take 1 capsule (100 mg total) by mouth 2 (two) times daily.   famotidine 20 MG tablet Commonly known as: PEPCID Take 20 mg by mouth 2 (two) times daily.   gabapentin 300 MG capsule Commonly  known as: NEURONTIN TAKE 1 CAPSULE (300 MG TOTAL) BY MOUTH EVERY Virag AT BEDTIME   HYDROcodone-acetaminophen 5-325 MG tablet Commonly known as: NORCO/VICODIN Take 1 tablet by mouth every 4 (four) hours as needed for moderate pain (pain score 4-6).   lisinopril-hydrochlorothiazide 20-25 MG tablet Commonly known as: ZESTORETIC Take 1 tablet by mouth daily.   methocarbamol 750 MG tablet Commonly known as: Robaxin-750 Take 1 tablet (750 mg total) by mouth every 8 (eight) hours as needed for muscle spasms.               Durable Medical Equipment  (From admission, onward)           Start     Ordered   07/22/22 0811  For home use only DME 3 n 1  Once        07/22/22 0810   07/22/22 0811  For home use only DME Walker  rolling  Once       Question Answer Comment  Walker: With 5 Inch Wheels   Patient needs a walker to treat with the following condition Osteoarthritis of right hip      07/22/22 0810            Diagnostic Studies: DG HIP UNILAT WITH PELVIS 1V RIGHT  Result Date: 07/21/2022 CLINICAL DATA:  Right shoulder arthroplasty intraoperative fluoroscopy. EXAM: DG HIP (WITH OR WITHOUT PELVIS) 1V RIGHT COMPARISON:  Pelvis and right hip radiographs 01/23/2015 FINDINGS: Images were performed intraoperatively without the presence of a radiologist. Interval total right hip arthroplasty. No hardware complication is seen. Total fluoroscopy images: 2 Total fluoroscopy time: 8 seconds Total dose: Radiation Exposure Index (as provided by the fluoroscopic device): 1.555 mGy air Kerma Please see intraoperative findings for further detail. IMPRESSION: Intraoperative fluoroscopy for total right hip arthroplasty. Electronically Signed   By: Yvonne Kendall M.D.   On: 07/21/2022 12:01   DG C-Arm 1-60 Min-No Report  Result Date: 07/21/2022 Fluoroscopy was utilized by the requesting physician.  No radiographic interpretation.    Disposition: Discharge disposition: 01-Home or Self Care            Signed: Carlynn Spry ,PA-C 07/22/2022, 8:11 AM

## 2022-07-23 LAB — SURGICAL PATHOLOGY

## 2022-07-28 DIAGNOSIS — M1611 Unilateral primary osteoarthritis, right hip: Secondary | ICD-10-CM | POA: Diagnosis not present

## 2022-08-04 DIAGNOSIS — M1611 Unilateral primary osteoarthritis, right hip: Secondary | ICD-10-CM | POA: Diagnosis not present

## 2022-08-10 DIAGNOSIS — M1611 Unilateral primary osteoarthritis, right hip: Secondary | ICD-10-CM | POA: Diagnosis not present

## 2022-08-12 NOTE — H&P (Signed)
NAME: Janet Walters MRN:   [id number] DOB:   13-Apr-1961     HISTORY AND PHYSICAL  CHIEF COMPLAINT:  right hip pain  HISTORY:   Janet Walters a 61 y.o. female  with right  Hip Pain Patient complains of right hip pain. Onset of the symptoms was several years ago. Inciting event: known DJD. The patient reports the hip pain is worse with weight bearing. Associated symptoms: none. Aggravating symptoms include: any weight bearing. Patient has had no prior hip problems. Previous visits for this problem: yes, last seen several weeks ago by me. Evaluation to date: plain films, which were abnormal  osteoarthritis . Treatment to date: OTC analgesics, which have been somewhat effective, prescription analgesics, which have been somewhat effective, and physical therapy, which has been somewhat effective.    Plan for right total hip replacement  PAST MEDICAL HISTORY:   Past Medical History:  Diagnosis Date   Back pain, chronic    DDD (degenerative disc disease), lumbar    Genital herpes    GERD (gastroesophageal reflux disease)    Hypertension    Insomnia     PAST SURGICAL HISTORY:   Past Surgical History:  Procedure Laterality Date   COLONOSCOPY     COLONOSCOPY WITH PROPOFOL N/A 09/18/2016   Procedure: COLONOSCOPY WITH PROPOFOL;  Surgeon: Manya Silvas, MD;  Location: [facility];  Service: Endoscopy;  Laterality: N/A;   TOTAL HIP ARTHROPLASTY Right 07/21/2022   Procedure: TOTAL HIP ARTHROPLASTY ANTERIOR APPROACH;  Surgeon: Lovell Sheehan, MD;  Location: [facility];  Service: Orthopedics;  Laterality: Right;    MEDICATIONS:   No medications prior to admission.    ALLERGIES:  No Known Allergies  REVIEW OF SYSTEMS:   Negative except HPI  FAMILY HISTORY:   Family History  Problem Relation Age of Onset   Breast cancer Paternal Aunt    Breast cancer Paternal Aunt    Breast cancer Paternal Aunt     SOCIAL HISTORY:   reports that she has never smoked. She has never used smokeless tobacco.  She reports that she does not drink alcohol and does not use drugs.  PHYSICAL EXAM:  General appearance: alert, cooperative, and no distress Neck: no JVD and supple, symmetrical, trachea midline Resp: clear to auscultation bilaterally Cardio: regular rate and rhythm, S1, S2 normal, no murmur, click, rub or gallop GI: soft, non-tender; bowel sounds normal; no masses,  no organomegaly Extremities: extremities normal, atraumatic, no cyanosis or edema and Homans sign is negative, no sign of DVT Pulses: 2+ and symmetric Skin: Skin color, texture, turgor normal. No rashes or lesions    LABORATORY STUDIES: No results for input(s): "WBC", "HGB", "HCT", "PLT" in the last 72 hours.  No results for input(s): "NA", "K", "CL", "CO2", "GLUCOSE", "BUN", "CREATININE", "CALCIUM" in the last 72 hours.  STUDIES/RESULTS:  DG HIP UNILAT WITH PELVIS 1V RIGHT  Result Date: 07/21/2022 CLINICAL DATA:  Right shoulder arthroplasty intraoperative fluoroscopy. EXAM: DG HIP (WITH OR WITHOUT PELVIS) 1V RIGHT COMPARISON:  Pelvis and right hip radiographs 01/23/2015 FINDINGS: Images were performed intraoperatively without the presence of a radiologist. Interval total right hip arthroplasty. No hardware complication is seen. Total fluoroscopy images: 2 Total fluoroscopy time: 8 seconds Total dose: Radiation Exposure Index (as provided by the fluoroscopic device): 1.555 mGy air Kerma Please see intraoperative findings for further detail. IMPRESSION: Intraoperative fluoroscopy for total right hip arthroplasty. Electronically Signed   By: Yvonne Kendall M.D.   On: 07/21/2022 12:01   DG C-Arm  1-60 Min-No Report  Result Date: 07/21/2022 Fluoroscopy was utilized by the requesting physician.  No radiographic interpretation.    ASSESSMENT:  End stage osteoarthritis right hip        Principal Problem:   History of total hip replacement, right    PLAN:  Right Primary Total Hip   Carlynn Spry 08/12/2022. 8:39 AM

## 2022-08-18 DIAGNOSIS — M1611 Unilateral primary osteoarthritis, right hip: Secondary | ICD-10-CM | POA: Diagnosis not present

## 2022-08-20 DIAGNOSIS — M1611 Unilateral primary osteoarthritis, right hip: Secondary | ICD-10-CM | POA: Diagnosis not present

## 2022-08-25 DIAGNOSIS — M1611 Unilateral primary osteoarthritis, right hip: Secondary | ICD-10-CM | POA: Diagnosis not present

## 2022-08-27 DIAGNOSIS — M1611 Unilateral primary osteoarthritis, right hip: Secondary | ICD-10-CM | POA: Diagnosis not present

## 2022-08-31 DIAGNOSIS — Z800 Family history of malignant neoplasm of digestive organs: Secondary | ICD-10-CM | POA: Diagnosis not present

## 2022-08-31 DIAGNOSIS — K5730 Diverticulosis of large intestine without perforation or abscess without bleeding: Secondary | ICD-10-CM | POA: Diagnosis not present

## 2022-09-02 DIAGNOSIS — Z96641 Presence of right artificial hip joint: Secondary | ICD-10-CM | POA: Diagnosis not present

## 2022-09-08 DIAGNOSIS — M1611 Unilateral primary osteoarthritis, right hip: Secondary | ICD-10-CM | POA: Diagnosis not present

## 2022-09-21 DIAGNOSIS — M1612 Unilateral primary osteoarthritis, left hip: Secondary | ICD-10-CM | POA: Insufficient documentation

## 2022-10-14 DIAGNOSIS — M1612 Unilateral primary osteoarthritis, left hip: Secondary | ICD-10-CM | POA: Diagnosis not present

## 2022-10-15 DIAGNOSIS — M25552 Pain in left hip: Secondary | ICD-10-CM | POA: Diagnosis not present

## 2022-11-27 ENCOUNTER — Encounter: Payer: BC Managed Care – PPO | Admitting: Physician Assistant

## 2022-12-14 ENCOUNTER — Ambulatory Visit: Payer: BC Managed Care – PPO

## 2022-12-14 DIAGNOSIS — K640 First degree hemorrhoids: Secondary | ICD-10-CM | POA: Diagnosis not present

## 2022-12-14 DIAGNOSIS — Z800 Family history of malignant neoplasm of digestive organs: Secondary | ICD-10-CM | POA: Diagnosis not present

## 2022-12-14 DIAGNOSIS — K5730 Diverticulosis of large intestine without perforation or abscess without bleeding: Secondary | ICD-10-CM | POA: Diagnosis not present

## 2022-12-14 DIAGNOSIS — Z1211 Encounter for screening for malignant neoplasm of colon: Secondary | ICD-10-CM | POA: Diagnosis not present

## 2022-12-24 DIAGNOSIS — M25552 Pain in left hip: Secondary | ICD-10-CM | POA: Diagnosis not present

## 2023-02-26 DIAGNOSIS — M1612 Unilateral primary osteoarthritis, left hip: Secondary | ICD-10-CM | POA: Diagnosis not present

## 2023-03-07 ENCOUNTER — Other Ambulatory Visit: Payer: Self-pay | Admitting: Physician Assistant

## 2023-03-07 DIAGNOSIS — G8929 Other chronic pain: Secondary | ICD-10-CM

## 2023-03-07 DIAGNOSIS — I10 Essential (primary) hypertension: Secondary | ICD-10-CM

## 2023-03-18 ENCOUNTER — Ambulatory Visit: Payer: BC Managed Care – PPO | Admitting: Physician Assistant

## 2023-03-18 ENCOUNTER — Encounter: Payer: Self-pay | Admitting: Physician Assistant

## 2023-03-18 VITALS — BP 139/80 | HR 78 | Wt 254.7 lb

## 2023-03-18 DIAGNOSIS — R21 Rash and other nonspecific skin eruption: Secondary | ICD-10-CM | POA: Diagnosis not present

## 2023-03-18 MED ORDER — DOXYCYCLINE HYCLATE 100 MG PO TABS: 100.0000 mg | ORAL_TABLET | Freq: Two times a day (BID) | ORAL | 0 refills | Status: DC

## 2023-03-18 NOTE — Progress Notes (Unsigned)
Established patient visit  Patient: Janet Walters   DOB: 1961-07-16   62 y.o. Female  MRN: [id number] Visit Date: 03/18/2023  Today's healthcare provider: Debera Lat, PA-C   Chief Complaint  Patient presents with   Rash   Subjective    HPI  The patient presents with a skin lesion that they noticed 'getting bigger.' The lesion is located on the leg that previously underwent hip replacement. They deny pruritus and pain, except when the lesion is palpated. They have been managing the lesion with Benadryl spray, alcohol, and triple antibiotic cream. They have been spending time outside, but deny known exposure to ticks or spiders. They deny fever.     03/18/2023    3:00 PM 04/27/2022    3:48 PM 11/13/2021    3:03 PM  Depression screen PHQ 2/9  Decreased Interest 0 0 0  Down, Depressed, Hopeless 0 0 0  PHQ - 2 Score 0 0 0  Altered sleeping 2  0  Tired, decreased energy 0 0 1  Change in appetite 0 0 0  Feeling bad or failure about yourself  0 0 0  Trouble concentrating 0 0 0  Moving slowly or fidgety/restless 0 1 0  Suicidal thoughts 0 0 0  PHQ-9 Score 2  1  Difficult doing work/chores Not difficult at all Not difficult at all Not difficult at all       No data to display          Medications: Outpatient Medications Prior to Visit  Medication Sig   aspirin 81 MG chewable tablet Chew 1 tablet (81 mg total) by mouth 2 (two) times daily.   docusate sodium (COLACE) 100 MG capsule Take 1 capsule (100 mg total) by mouth 2 (two) times daily.   famotidine (PEPCID) 20 MG tablet Take 20 mg by mouth 2 (two) times daily.   gabapentin (NEURONTIN) 300 MG capsule TAKE 1 CAPSULE (300 MG TOTAL) BY MOUTH EVERY Hitch AT BEDTIME   HYDROcodone-acetaminophen (NORCO/VICODIN) 5-325 MG tablet Take 1 tablet by mouth every 4 (four) hours as needed for moderate pain (pain score 4-6).   lisinopril-hydrochlorothiazide (ZESTORETIC) 20-25 MG tablet TAKE 1 TABLET BY MOUTH EVERY Fennel   methocarbamol  (ROBAXIN-750) 750 MG tablet Take 1 tablet (750 mg total) by mouth every 8 (eight) hours as needed for muscle spasms.   No facility-administered medications prior to visit.    Review of Systems Except see HPI   {Labs  Heme  Chem  Endocrine  Serology  Results Review (optional):23779}   Objective    BP 139/80 (BP Location: Left Arm, Patient Position: Sitting, Cuff Size: Normal)   Pulse 78   Wt 254 lb 11.2 oz (115.5 kg)   SpO2 98%   BMI 39.89 kg/m  {Show previous vital signs (optional):23777}  Physical Exam Constitutional:      General: She is not in acute distress.    Appearance: Normal appearance.  HENT:     Head: Normocephalic.  Pulmonary:     Effort: Pulmonary effort is normal. No respiratory distress.  Skin:    Findings: Erythema, lesion and rash present.     Comments: Blister-like lesion, non-itchy, mild pain upon palpation.  Neurological:     Mental Status: She is alert and oriented to person, place, and time. Mental status is at baseline.  Psychiatric:        Behavior: Behavior normal.        Thought Content: Thought content normal.  Judgment: Judgment normal.    Picture of the rash/lesion in the media  No results found for any visits on 03/18/23.  Assessment & Plan    Skin Lesion: Noticed a few days ago, has been increasing in size. No itching, but mild pain on palpation. Appearance suggestive of a blister. Possible exposure to outdoor elements. No improvement with Benadryl spray or triple antibiotic ointment. -Refer to Dermatology for further evaluation. -Continue Benadryl spray and triple antibiotic ointment. -Avoid alcohol as it can irritate the skin. -Advise a trial of antibiotics -RTC if no improvements.  Diabetes Screening: Last A1C was 6.4 10 mo ago. -Pt will contact us about her current blood sugar.  No follow-ups on file.     The patient was advised to call back or seek an in-person evaluation if the symptoms worsen or if the condition  fails to improve as anticipated.  I discussed the assessment and treatment plan with the patient. The patient was provided an opportunity to ask questions and all were answered. The patient agreed with the plan and demonstrated an understanding of the instructions.  I, Debera Lat, PA-C have reviewed all documentation for this visit. The documentation on 03/18/23 for the exam, diagnosis, procedures, and orders are all accurate and complete.  Debera Lat, [facility], MMS [facility] [phone] (phone) [phone] (fax)  [facility]

## 2023-03-19 ENCOUNTER — Ambulatory Visit: Payer: Self-pay

## 2023-03-19 NOTE — Telephone Encounter (Signed)
  Chief Complaint: Pt calling to update provider on rash Symptoms: Rash on leg Frequency:  Pertinent Negatives: Patient denies  Disposition: [] ED /[] Urgent Care (no appt availability in office) / [] Appointment(In office/virtual)/ []  [facility]/ [] Home Care/ [] Refused Recommended Disposition /[] [facility]/ [x]  Follow-up with PCP Additional Notes: Pt states that there is no change in rash. Pt will continue to monitor over the weekend, and call back Monday. If pt notices worrying changes in rash she will seek care immediately.  Summary: red mark on  leg   Pt called in about  leg, she says its still the same even after given antibiotics yesterday that she has been taking. She says Dr Ok Edwards didn't know if it was a rash or something else, and she was told to call back to let her know how its doing with the med.     Reason for Disposition  Caller requesting routine or non-urgent lab result  Answer Assessment - Initial Assessment Questions 1. REASON FOR CALL or QUESTION: "What is your reason for calling today?" or "How can I best help you?" or "What question do you have that I can help answer?"     Pt called to give update on rash 2. CALLER: Document the source of call. (e.g., laboratory, patient).     Patient  Protocols used: PCP Call - No Triage-A-AH

## 2023-03-19 NOTE — Telephone Encounter (Signed)
Summary: red mark on  leg   Pt called in about  leg, she says its still the same even after given antibiotics yesterday that she has been taking. She says Dr Drubel didn't know if it was a rash or something else, and she was told to call back to let her know how its doing with the med.        Called pt - left message on machine to return our call. 

## 2023-03-19 NOTE — Telephone Encounter (Signed)
Summary: red mark on  leg   Pt called in about  leg, she says its still the same even after given antibiotics yesterday that she has been taking. She says Dr Ok Edwards didn't know if it was a rash or something else, and she was told to call back to let her know how its doing with the med.        Called pt - left message on machine to return our call.

## 2023-04-07 ENCOUNTER — Telehealth: Payer: Self-pay

## 2023-04-07 NOTE — Telephone Encounter (Signed)
Copied from CRM [id number]. Topic: General - Other >> Apr 07, 2023  9:30 AM Turkey B wrote: Reason for CRM: pt called in to get handicapped sticker, since she has hip problems

## 2023-04-09 DIAGNOSIS — Z0279 Encounter for issue of other medical certificate: Secondary | ICD-10-CM

## 2023-04-09 NOTE — Telephone Encounter (Signed)
Patient advised. Verbalized understandind

## 2023-05-13 ENCOUNTER — Ambulatory Visit
Admission: RE | Admit: 2023-05-13 | Discharge: 2023-05-13 | Disposition: A | Discharge status: Home / Self Care | Payer: BC Managed Care – PPO | Source: Ambulatory Visit | Attending: Physician Assistant | Admitting: Physician Assistant

## 2023-05-13 ENCOUNTER — Ambulatory Visit: Payer: BC Managed Care – PPO | Admitting: Physician Assistant

## 2023-05-13 VITALS — BP 131/62 | HR 91 | Temp 97.9°F | Ht 66.0 in | Wt 253.3 lb

## 2023-05-13 DIAGNOSIS — M25551 Pain in right hip: Secondary | ICD-10-CM

## 2023-05-13 DIAGNOSIS — R350 Frequency of micturition: Secondary | ICD-10-CM

## 2023-05-13 DIAGNOSIS — Z96641 Presence of right artificial hip joint: Secondary | ICD-10-CM

## 2023-05-13 DIAGNOSIS — G8929 Other chronic pain: Secondary | ICD-10-CM | POA: Diagnosis not present

## 2023-05-13 DIAGNOSIS — E669 Obesity, unspecified: Secondary | ICD-10-CM

## 2023-05-13 DIAGNOSIS — M25552 Pain in left hip: Secondary | ICD-10-CM | POA: Insufficient documentation

## 2023-05-13 DIAGNOSIS — M1612 Unilateral primary osteoarthritis, left hip: Secondary | ICD-10-CM | POA: Diagnosis not present

## 2023-05-13 LAB — POCT URINALYSIS DIPSTICK
Blood, UA: NEGATIVE
Glucose, UA: NEGATIVE
Ketones, UA: NEGATIVE
Leukocytes, UA: NEGATIVE
Nitrite, UA: NEGATIVE
Protein, UA: NEGATIVE
Spec Grav, UA: 1.02 (ref 1.010–1.025)
Urobilinogen, UA: 0.2 E.U./dL
pH, UA: 5 (ref 5.0–8.0)

## 2023-05-13 MED ORDER — CELECOXIB 100 MG PO CAPS: 100.0000 mg | ORAL_CAPSULE | Freq: Two times a day (BID) | ORAL | 1 refills | Status: DC

## 2023-05-13 MED ORDER — PREDNISONE 20 MG PO TABS: 20.0000 mg | ORAL_TABLET | Freq: Every day | ORAL | 0 refills | Status: DC

## 2023-05-13 NOTE — Progress Notes (Unsigned)
Established patient visit  Patient: Janet Walters   DOB: [birth year]   62 y.o. Female  MRN: [id number] Visit Date: 05/13/2023  Today's healthcare provider: Debera Lat, PA-C   Chief Complaint  Patient presents with   Medical Management of Chronic Issues    Pain located in the left hip and leg, pain has become a problem and becoming worse within the last two weeks, future cortisone injection with [facility]   Subjective    HPI HPI     Medical Management of Chronic Issues    Additional comments: Pain located in the left hip and leg, pain has become a problem and becoming worse within the last two weeks, future cortisone injection with [facility]      Last edited by Rolly Salter, CMA on 05/13/2023  1:08 PM.      *** Discussed the use of AI scribe software for clinical note transcription with the patient, who gave verbal consent to proceed.  History of Present Illness   The patient, with a history of right hip replacement, presents with chronic left hip pain. The pain is described as sharp, deep, and severe, rating it as a 10 on a scale of 10. The pain originates in the hip and radiates down the front of the leg. The patient reports no significant relief from the pain despite taking hydrocodone, gabapentin, and Tylenol. The patient has received two steroid injections for the left hip pain, the most recent one being in May of the current year. The patient also reports a sense of urinary urgency but denies any pain, blood, or changes in the urine. The patient is active, engaging in activities such as mowing the yard.           03/18/2023    3:00 PM 04/27/2022    3:48 PM 11/13/2021    3:03 PM  Depression screen PHQ 2/9  Decreased Interest 0 0 0  Down, Depressed, Hopeless 0 0 0  PHQ - 2 Score 0 0 0  Altered sleeping 2  0  Tired, decreased energy 0 0 1  Change in appetite 0 0 0  Feeling bad or failure about yourself  0 0 0  Trouble concentrating 0 0 0  Moving slowly or  fidgety/restless 0 1 0  Suicidal thoughts 0 0 0  PHQ-9 Score 2  1  Difficult doing work/chores Not difficult at all Not difficult at all Not difficult at all       No data to display          Medications: Outpatient Medications Prior to Visit  Medication Sig   doxycycline (VIBRA-TABS) 100 MG tablet Take 1 tablet (100 mg total) by mouth 2 (two) times daily.   famotidine (PEPCID) 20 MG tablet Take 20 mg by mouth 2 (two) times daily.   gabapentin (NEURONTIN) 300 MG capsule TAKE 1 CAPSULE (300 MG TOTAL) BY MOUTH EVERY [place] AT BEDTIME   HYDROcodone-acetaminophen (NORCO/VICODIN) 5-325 MG tablet Take 1 tablet by mouth every 4 (four) hours as needed for moderate pain (pain score 4-6).   lisinopril-hydrochlorothiazide (ZESTORETIC) 20-25 MG tablet TAKE 1 TABLET BY MOUTH EVERY [place]   methocarbamol (ROBAXIN-750) 750 MG tablet Take 1 tablet (750 mg total) by mouth every 8 (eight) hours as needed for muscle spasms.   [DISCONTINUED] aspirin 81 MG chewable tablet Chew 1 tablet (81 mg total) by mouth 2 (two) times daily.   [DISCONTINUED] docusate sodium (COLACE) 100 MG capsule Take 1 capsule (100 mg total) by mouth  2 (two) times daily.   No facility-administered medications prior to visit.    Review of Systems  All other systems reviewed and are negative.  Except see HPI   {Insert previous labs (optional):23779}  {See past labs  Heme  Chem  Endocrine  Serology  Results Review (optional):1}   Objective    BP 131/62 (BP Location: Left Arm, Patient Position: Sitting, Cuff Size: Large)   Pulse 91   Temp 97.9 F (36.6 C) (Oral)   Ht 5\' 6"  (1.676 m)   Wt 253 lb 4.8 oz (114.9 kg)   SpO2 98%   BMI 40.88 kg/m  {Insert last BP/Wt (optional):23777}  {See vitals history (optional):1}  Physical Exam   Results for orders placed or performed in visit on 05/13/23  POCT Urinalysis Dipstick  Result Value Ref Range   Color, UA Gold    Clarity, UA clear    Glucose, UA Negative Negative    Bilirubin, UA +    Ketones, UA Negative    Spec Grav, UA 1.020 1.010 - 1.025   Blood, UA Negative    pH, UA 5.0 5.0 - 8.0   Protein, UA Negative Negative   Urobilinogen, UA 0.2 0.2 or 1.0 E.U./dL   Nitrite, UA Neg    Leukocytes, UA Negative Negative   Appearance     Odor      Assessment & Plan    *** Assessment and Plan    Left Hip Pain Severe, sharp pain radiating from the left hip to the front of the leg. History of right hip replacement. Pain increases with prolonged sitting and standing. No recent injury reported. Pain similar to previous right hip pain before replacement. -Order X-ray of left hip to confirm osteoarthritis. -Increase Gabapentin dose to two tablets at night. -Prescribe Celebrex to alternate with Tylenol for pain management. -Consider steroid prescription for inflammation. -Schedule follow-up in two weeks.  Urinary Urgency Reports of occasional urinary urgency. No pain, blood, or changes in urine reported. -Collect urine sample for testing.  Weight Management Discussion of weight management in relation to hip pain and potential surgery. No specific plan discussed. -Consider discussing weight management strategies in future consultations.  Right Hip Replacement Reports of occasional pulling sensation in right hip post-replacement in October 2023. No severe pain reported. -Continue current management and monitor in future consultations.         No follow-ups on file.     The patient was advised to call back or seek an in-person evaluation if the symptoms worsen or if the condition fails to improve as anticipated.  I discussed the assessment and treatment plan with the patient. The patient was provided an opportunity to ask questions and all were answered. The patient agreed with the plan and demonstrated an understanding of the instructions.  I, Debera Lat, PA-C have reviewed all documentation for this visit. The documentation on  @CurDate @  for the  exam, diagnosis, procedures, and orders are all accurate and complete.  Debera Lat, [facility], MMS [facility] [phone] (phone) [phone] (fax)  [facility]

## 2023-05-15 DIAGNOSIS — E669 Obesity, unspecified: Secondary | ICD-10-CM | POA: Insufficient documentation

## 2023-05-27 ENCOUNTER — Other Ambulatory Visit: Payer: Self-pay | Admitting: Physician Assistant

## 2023-05-27 DIAGNOSIS — Z1231 Encounter for screening mammogram for malignant neoplasm of breast: Secondary | ICD-10-CM

## 2023-06-04 DIAGNOSIS — M1612 Unilateral primary osteoarthritis, left hip: Secondary | ICD-10-CM | POA: Diagnosis not present

## 2023-06-11 ENCOUNTER — Telehealth: Payer: Self-pay | Admitting: Physician Assistant

## 2023-06-11 NOTE — Telephone Encounter (Signed)
Pt is calling to report that her insurance company [employer]) is requesting Edmon Crape to send a form. And she would know exactly what kind of form to send for the weight loss medication. Pt report she would rather have a weight loss pill than an injection. Please advise CB- [phone]

## 2023-06-15 ENCOUNTER — Encounter: Payer: Self-pay | Admitting: Physician Assistant

## 2023-06-15 DIAGNOSIS — E669 Obesity, unspecified: Secondary | ICD-10-CM

## 2023-06-24 ENCOUNTER — Ambulatory Visit
Admission: RE | Admit: 2023-06-24 | Discharge: 2023-06-24 | Disposition: A | Discharge status: Home / Self Care | Payer: BC Managed Care – PPO | Source: Ambulatory Visit | Attending: Physician Assistant | Admitting: Physician Assistant

## 2023-06-24 DIAGNOSIS — Z1231 Encounter for screening mammogram for malignant neoplasm of breast: Secondary | ICD-10-CM | POA: Insufficient documentation

## 2023-07-15 IMAGING — MG MM DIGITAL SCREENING BILAT W/ TOMO AND CAD
8 series · 8 of 24 positions shown · non-contrast
Comparison: Previous exam(s).

CLINICAL DATA: Screening.

EXAM:
DIGITAL SCREENING BILATERAL MAMMOGRAM WITH TOMOSYNTHESIS AND CAD
TECHNIQUE: Bilateral screening digital craniocaudal and mediolateral oblique
mammograms were obtained. Bilateral screening digital breast
tomosynthesis was performed. The images were evaluated with
computer-aided detection.

[L MLO synth-2D]
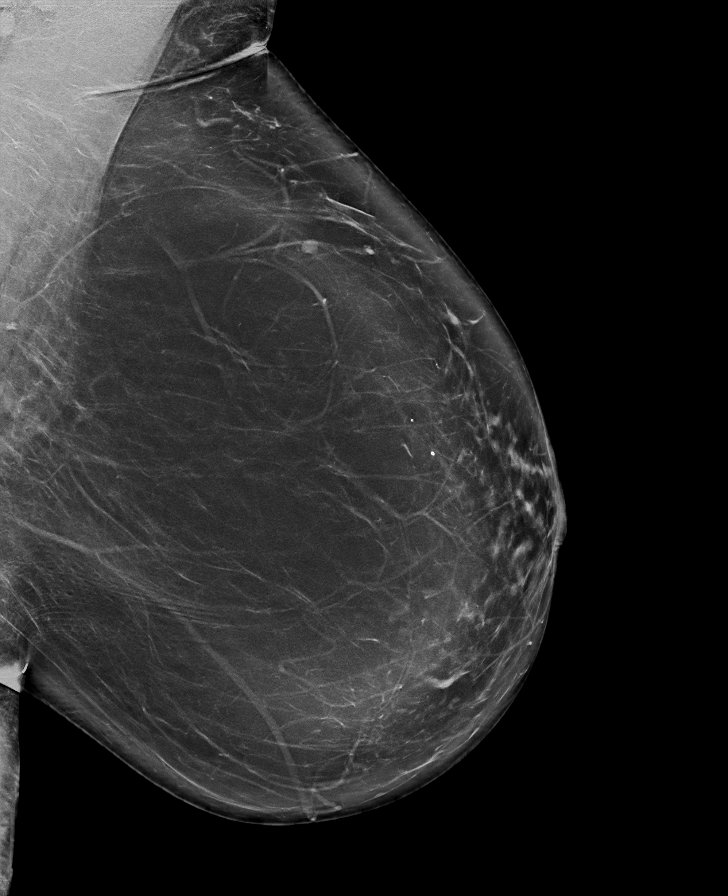

[R MLO synth-2D]
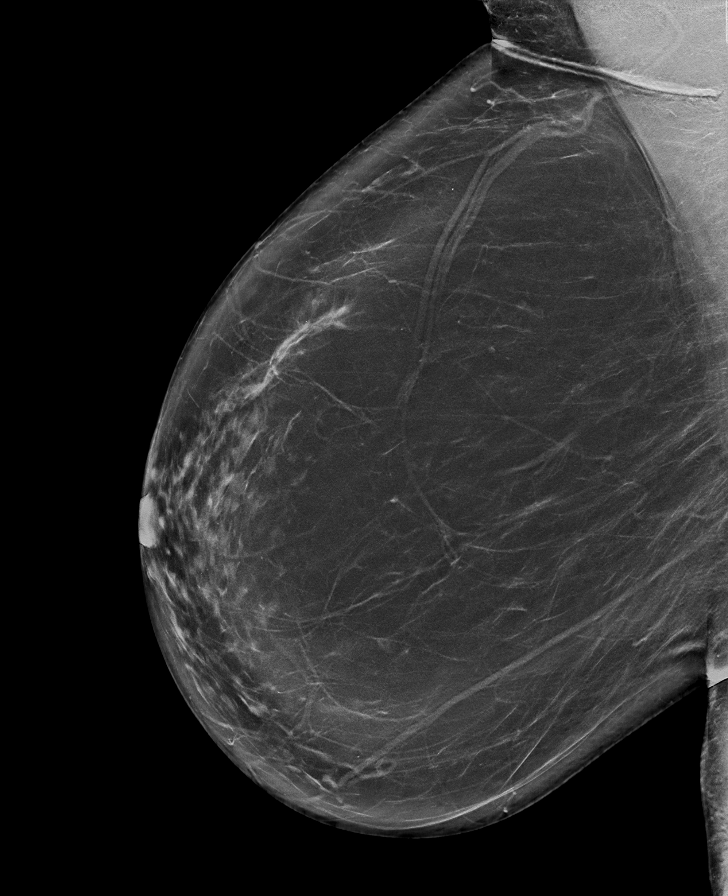

[R CC synth-2D]
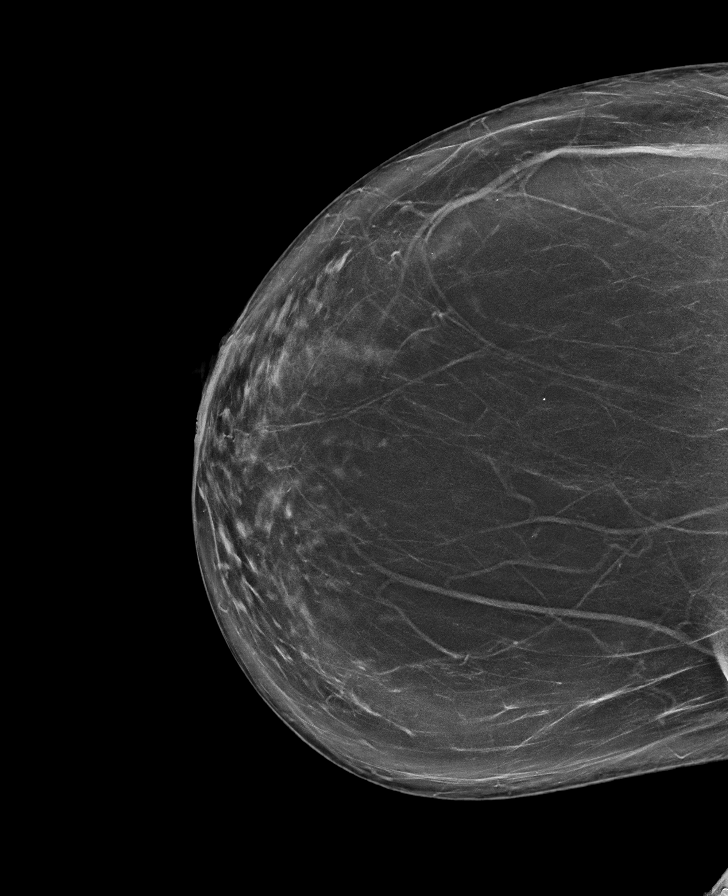

[L CC synth-2D]
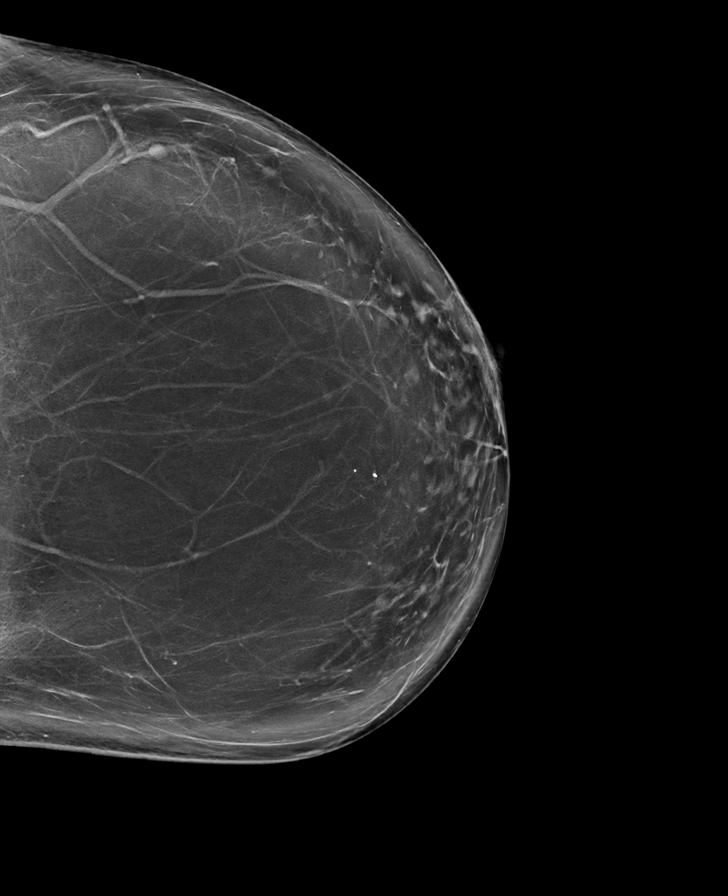

[L MLO tomo · tomo slice 47/92.0]
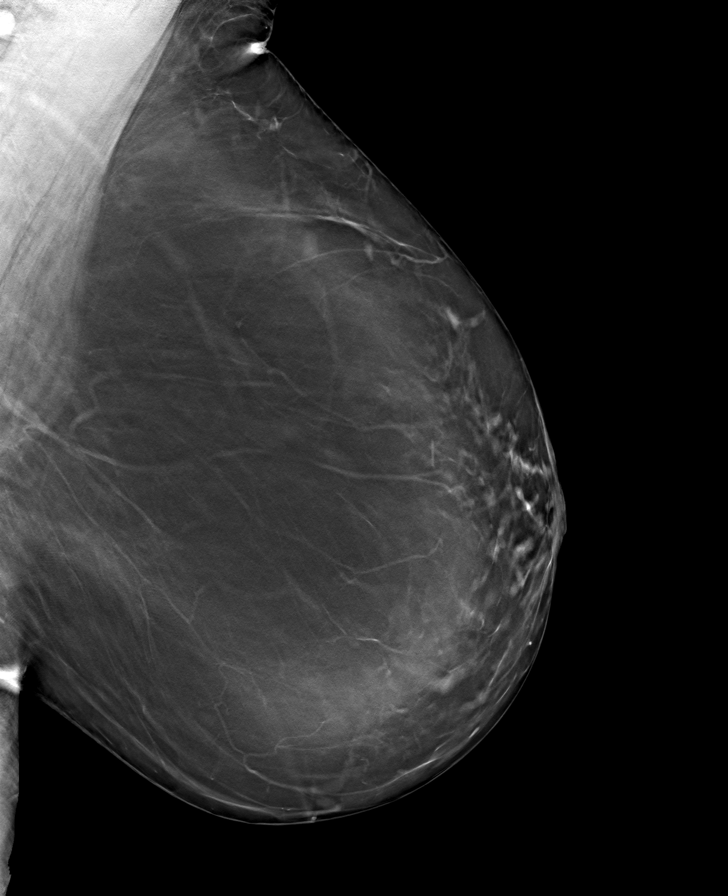

[R CC tomo · tomo slice 45/89.0]
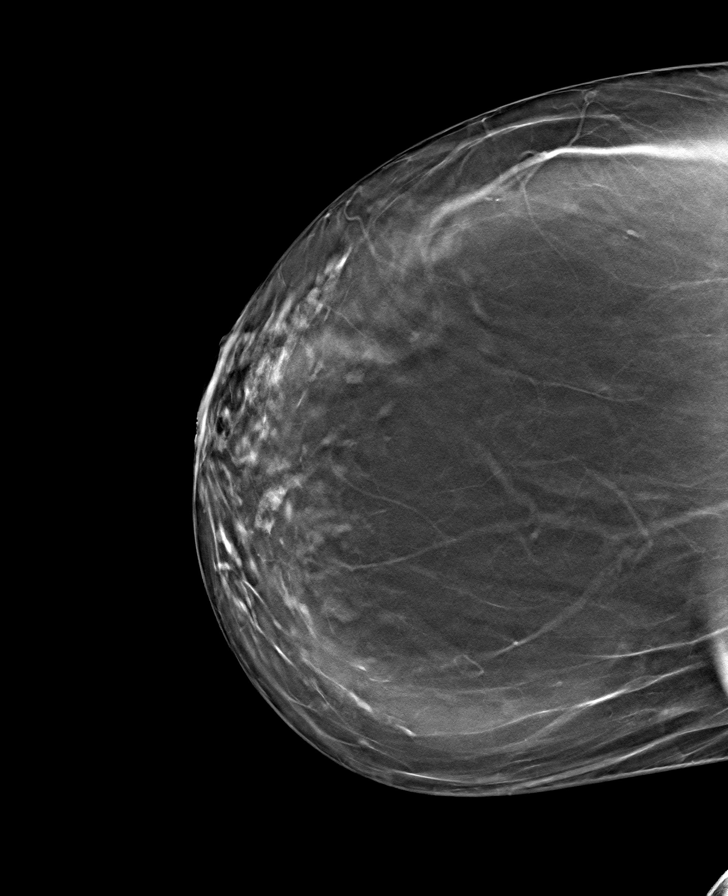

[L CC tomo · tomo slice 47/94.0]
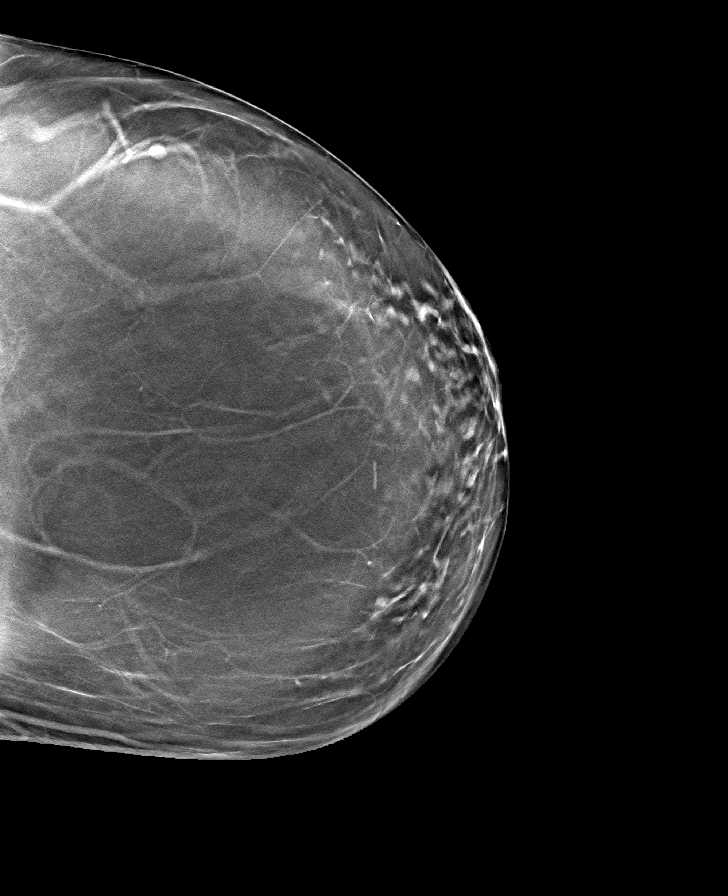

[R MLO tomo · tomo slice 46/91.0]
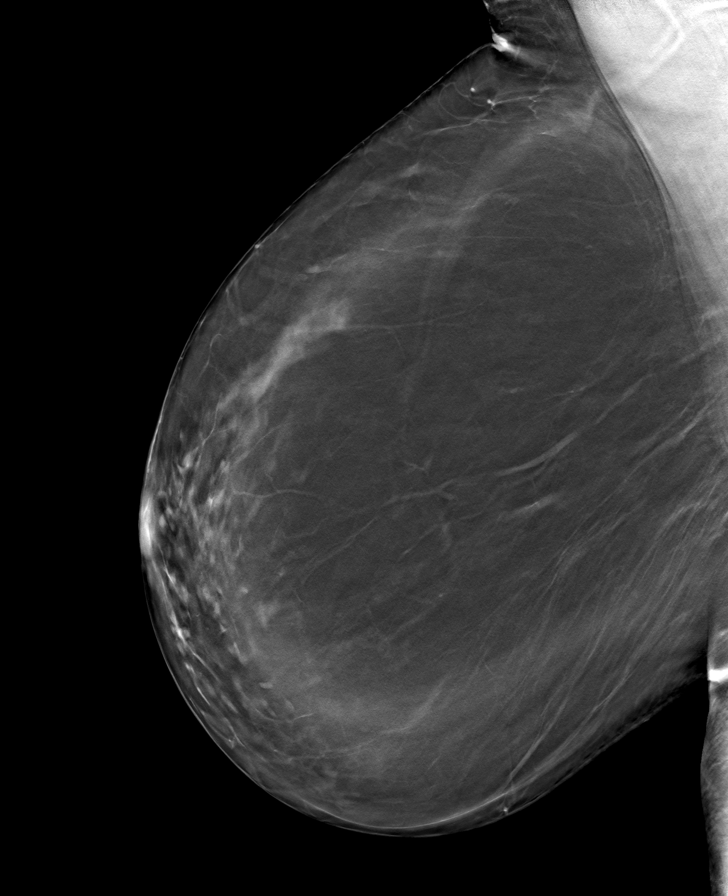

[8 of 24 positions shown; findings below may reference images not displayed]

ACR Breast Density Category b: There are scattered areas of
fibroglandular density.
FINDINGS: There are no findings suspicious for malignancy.
IMPRESSION: No mammographic evidence of malignancy. A result letter of this
screening mammogram will be mailed directly to the patient.

RECOMMENDATION:
Screening mammogram in one year. (Code:51-O-LD2)

BI-RADS CATEGORY  1: Negative.

## 2023-10-11 DIAGNOSIS — M25552 Pain in left hip: Secondary | ICD-10-CM | POA: Diagnosis not present

## 2023-10-24 ENCOUNTER — Other Ambulatory Visit: Payer: Self-pay | Admitting: Physician Assistant

## 2023-10-24 DIAGNOSIS — G8929 Other chronic pain: Secondary | ICD-10-CM

## 2023-10-26 NOTE — Telephone Encounter (Signed)
 Requested medication (s) are due for refill today: yes  Requested medication (s) are on the active medication list: yes  Last refill:  05/13/23  Future visit scheduled: no  Notes to clinic:  Unable to refill per protocol due to failed labs, no updated results.      Requested Prescriptions  Pending Prescriptions Disp Refills   celecoxib  (CELEBREX ) 100 MG capsule [Pharmacy Med Name: CELECOXIB  100 MG CAPSULE] 60 capsule 1    Sig: TAKE 1 CAPSULE BY MOUTH TWICE A Elbaum     Analgesics:  COX2 Inhibitors Failed - 10/26/2023 10:56 AM      Failed - Manual Review: Labs are only required if the patient has taken medication for more than 8 weeks.      Failed - HGB in normal range and within 360 days    Hemoglobin  Date Value Ref Range Status  07/09/2022 13.1 12.0 - 15.0 g/dL Final  94/96/7977 86.9 11.1 - 15.9 g/dL Final         Failed - Cr in normal range and within 360 days    Creatinine, Ser  Date Value Ref Range Status  07/09/2022 0.78 0.44 - 1.00 mg/dL Final         Failed - HCT in normal range and within 360 days    HCT  Date Value Ref Range Status  07/09/2022 40.5 36.0 - 46.0 % Final   Hematocrit  Date Value Ref Range Status  02/11/2021 40.3 34.0 - 46.6 % Final         Failed - AST in normal range and within 360 days    AST  Date Value Ref Range Status  04/27/2022 14 0 - 40 IU/L Final         Failed - ALT in normal range and within 360 days    ALT  Date Value Ref Range Status  04/27/2022 12 0 - 32 IU/L Final         Failed - eGFR is 30 or above and within 360 days    GFR calc Af Amer  Date Value Ref Range Status  04/17/2018 >60 >60 mL/min Final    Comment:    (NOTE) The eGFR has been calculated using the CKD EPI equation. This calculation has not been validated in all clinical situations. eGFR's persistently <60 mL/min signify possible Chronic Kidney Disease.    GFR, Estimated  Date Value Ref Range Status  07/09/2022 >60 >60 mL/min Final    Comment:     (NOTE) Calculated using the CKD-EPI Creatinine Equation (2021)    eGFR  Date Value Ref Range Status  04/27/2022 80 >59 mL/min/1.73 Final         Passed - Patient is not pregnant      Passed - Valid encounter within last 12 months    Recent Outpatient Visits           5 months ago Chronic hip pain, left   [place] [facility] [place], [place], PA-C   7 months ago Rash   [place] [facility] [place], [place], PA-C   1 year ago Chronic hip pain, bilateral   [facility] Cyndi Shaver, PA-C   1 year ago Chronic bilateral low back pain with bilateral sciatica   [facility] Cyndi Shaver, PA-C   1 year ago Encounter for health maintenance examination   [facility] Cyndi Shaver, PA-C

## 2023-10-30 ENCOUNTER — Other Ambulatory Visit: Payer: Self-pay | Admitting: Physician Assistant

## 2023-10-30 DIAGNOSIS — G8929 Other chronic pain: Secondary | ICD-10-CM

## 2023-11-01 ENCOUNTER — Other Ambulatory Visit: Payer: Self-pay | Admitting: Physician Assistant

## 2023-11-01 DIAGNOSIS — G8929 Other chronic pain: Secondary | ICD-10-CM

## 2023-11-01 NOTE — Telephone Encounter (Signed)
Medication Refill -  Most Recent Primary Care Visit:  Provider: Debera Lat  Department: [facility]  Visit Type: OFFICE VISIT  Date: 05/13/2023  Medication: gabapentin (NEURONTIN) 300 MG capsule   Has the patient contacted their pharmacy? Yes   Is this the correct pharmacy for this prescription? Yes  This is the patient's preferred pharmacy:  [id number] [place], [place] - [address] [place] [address] Phone: [phone] Fax: [phone]   Has the prescription been filled recently? Yes  Is the patient out of the medication? Yes  Has the patient been seen for an appointment in the last year OR does the patient have an upcoming appointment? Yes  Can we respond through MyChart? Yes  Agent: Please be advised that Rx refills may take up to 3 business days. We ask that you follow-up with your pharmacy.

## 2023-11-02 NOTE — Telephone Encounter (Signed)
Requested medication (s) are due for refill today:   Yes  Requested medication (s) are on the active medication list:   Yes  Future visit scheduled:   No   Last ordered: 03/09/2023 #30, 5 refills  Unable to refill because cr. Is due per protocol   Requested Prescriptions  Pending Prescriptions Disp Refills   gabapentin (NEURONTIN) 300 MG capsule 30 capsule 5    Sig: TAKE 1 CAPSULE (300 MG TOTAL) BY MOUTH EVERY [place] AT BEDTIME     Neurology: Anticonvulsants - gabapentin Failed - 11/02/2023  7:42 AM      Failed - Cr in normal range and within 360 days    Creatinine, Ser  Date Value Ref Range Status  07/09/2022 0.78 0.44 - 1.00 mg/dL Final         Passed - Completed PHQ-2 or PHQ-9 in the last 360 days      Passed - Valid encounter within last 12 months    Recent Outpatient Visits           5 months ago Chronic hip pain, left   [place] [facility] [place], [place], PA-C   7 months ago Rash   [facility] [place], [place], PA-C   1 year ago Chronic hip pain, bilateral   [place] [facility] Alfredia Ferguson, PA-C   1 year ago Chronic bilateral low back pain with bilateral sciatica   [facility] Alfredia Ferguson, PA-C   1 year ago Encounter for health maintenance examination   [facility] Alfredia Ferguson, [place]

## 2023-11-04 MED ORDER — GABAPENTIN 300 MG PO CAPS: ORAL_CAPSULE | ORAL | 1 refills | Status: DC

## 2023-11-25 ENCOUNTER — Ambulatory Visit: Payer: BC Managed Care – PPO | Admitting: Physician Assistant

## 2023-11-25 VITALS — BP 134/78 | HR 73 | Resp 18

## 2023-11-25 DIAGNOSIS — R1030 Lower abdominal pain, unspecified: Secondary | ICD-10-CM

## 2023-11-25 DIAGNOSIS — M25511 Pain in right shoulder: Secondary | ICD-10-CM | POA: Diagnosis not present

## 2023-11-25 DIAGNOSIS — M25552 Pain in left hip: Secondary | ICD-10-CM

## 2023-11-25 DIAGNOSIS — G8929 Other chronic pain: Secondary | ICD-10-CM

## 2023-11-25 DIAGNOSIS — E669 Obesity, unspecified: Secondary | ICD-10-CM | POA: Diagnosis not present

## 2023-11-25 DIAGNOSIS — R7303 Prediabetes: Secondary | ICD-10-CM

## 2023-11-25 MED ORDER — PREDNISONE 20 MG PO TABS: 20.0000 mg | ORAL_TABLET | Freq: Every day | ORAL | 0 refills | Status: DC

## 2023-11-25 MED ORDER — PREDNISONE 20 MG PO TABS: 20.0000 mg | ORAL_TABLET | Freq: Two times a day (BID) | ORAL | 0 refills | Status: DC

## 2023-11-25 MED ORDER — CELECOXIB 200 MG PO CAPS: 200.0000 mg | ORAL_CAPSULE | Freq: Two times a day (BID) | ORAL | 0 refills | Status: DC

## 2023-11-25 NOTE — Progress Notes (Unsigned)
Established patient visit  Patient: Janet Walters   DOB: 09-01-1961   63 y.o. Female  MRN: [id number] Visit Date: 11/25/2023  Today's healthcare provider: Debera Lat, PA-C   Chief Complaint  Patient presents with   Abdominal Pain    Right side pain for 2 weeks, pain has gone away now   Subjective     HPI     Abdominal Pain    Additional comments: Right side pain for 2 weeks, pain has gone away now      Last edited by Ashok Cordia, CMA on 11/25/2023  3:53 PM.       Discussed the use of AI scribe software for clinical note transcription with the patient, who gave verbal consent to proceed.  History of Present Illness   The patient, with a history of right-sided abdominal pain, reports that the pain has resolved. The pain was localized to the right side and lasted for two weeks. The patient denied any associated back pain, fever, or urinary symptoms such as frequency or urgency.  The patient also has a longstanding history of hip pain, which has been managed with periodic steroid injections. The pain has been ongoing for a few years and is exacerbated by physical labor at work. The patient has had a previous hip surgery. The patient reports that the hip pain has not been adequately managed with the current treatment regimen, which includes Celebrex and gabapentin. The patient also reports that the hip pain has been interfering with her ability to exercise and maintain a healthy lifestyle.  The patient's other significant medical history includes a high A1c level close to the diabetes range, which was noted two years ago. The patient reports no recent changes in diet or exercise habits.           11/25/2023    3:55 PM 03/18/2023    3:00 PM 04/27/2022    3:48 PM  Depression screen PHQ 2/9  Decreased Interest 2 0 0  Down, Depressed, Hopeless 0 0 0  PHQ - 2 Score 2 0 0  Altered sleeping 1 2   Tired, decreased energy 0 0 0  Change in appetite 0 0 0  Feeling bad or failure  about yourself  0 0 0  Trouble concentrating 0 0 0  Moving slowly or fidgety/restless 1 0 1  Suicidal thoughts 0 0 0  PHQ-9 Score 4 2   Difficult doing work/chores Very difficult Not difficult at all Not difficult at all      11/25/2023    3:55 PM  GAD 7 : Generalized Anxiety Score  Nervous, Anxious, on Edge 0  Control/stop worrying 0  Worry too much - different things 1  Trouble relaxing 0  Restless 0  Easily annoyed or irritable 1  Afraid - awful might happen 0  Total GAD 7 Score 2  Anxiety Difficulty Not difficult at all    Medications: Outpatient Medications Prior to Visit  Medication Sig   gabapentin (NEURONTIN) 300 MG capsule TAKE 1 CAPSULE (300 MG TOTAL) BY MOUTH EVERY [place] AT BEDTIMETAKE 1 CAPSULE (300 MG TOTAL) BY MOUTH EVERY [place] AT BEDTIME   HYDROcodone-acetaminophen (NORCO/VICODIN) 5-325 MG tablet Take 1 tablet by mouth every 4 (four) hours as needed for moderate pain (pain score 4-6).   lisinopril-hydrochlorothiazide (ZESTORETIC) 20-25 MG tablet TAKE 1 TABLET BY MOUTH EVERY [place]   methocarbamol (ROBAXIN-750) 750 MG tablet Take 1 tablet (750 mg total) by mouth every 8 (eight) hours as needed for muscle spasms.   [  DISCONTINUED] celecoxib (CELEBREX) 100 MG capsule TAKE 1 CAPSULE BY MOUTH TWICE A Branca   [DISCONTINUED] doxycycline (VIBRA-TABS) 100 MG tablet Take 1 tablet (100 mg total) by mouth 2 (two) times daily.   [DISCONTINUED] famotidine (PEPCID) 20 MG tablet Take 20 mg by mouth 2 (two) times daily.   [DISCONTINUED] predniSONE (DELTASONE) 20 MG tablet Take 1 tablet (20 mg total) by mouth daily with breakfast.   No facility-administered medications prior to visit.    Review of Systems All negative Except see HPI   {Insert previous labs (optional):23779} {See past labs  Heme  Chem  Endocrine  Serology  Results Review (optional):1}   Objective    BP 134/78   Pulse 73   Resp 18   SpO2 99%  {Insert last BP/Wt (optional):23777}{See vitals history  (optional):1}   Physical Exam   No results found for any visits on 11/25/23.      Assessment and Plan    Right Lower Quadrant Abdominal Pain Pain resolved. No associated fever, back pain, or urinary symptoms. No evidence of infection on examination. -Consider urine analysis to rule out any underlying infection.  Left Hip Pain Chronic pain, previously managed with steroid injections. Recent injection provided no relief. No associated back pain or leg pain. -Increase Celebrex to twice daily with meals. -Prescribe short course of Prednisone. -Recommend patient to schedule an appointment with preferred orthopedic specialist for further management. -Encourage physical therapy and regular exercise.  Right Shoulder Pain Chronic pain, possibly related to overuse at work. -Increase Celebrex to twice daily with meals. -Recommend regular stretching and use of a brace for support.  Hyperglycemia Last A1C close to diabetic range two years ago. -Order updated lab work including A1C, lipid panel, and kidney function tests. -Encourage diet and exercise for weight loss and blood sugar control.  General Health Maintenance -Recommend Pap smear with OBGYN due to prolonged interval since last screening. -Schedule follow-up appointment in one month for physical examination.        Orders Placed This Encounter  Procedures   Hemoglobin A1c   Lipid panel    Has the patient fasted?:   Yes   CBC with Differential/Platelet   Comprehensive metabolic panel    Has the patient fasted?:   Yes   TSH    Return in about 4 weeks (around 12/23/2023) for CPE.   The patient was advised to call back or seek an in-person evaluation if the symptoms worsen or if the condition fails to improve as anticipated.  I discussed the assessment and treatment plan with the patient. The patient was provided an opportunity to ask questions and all were answered. The patient agreed with the plan and demonstrated an  understanding of the instructions.  I, Debera Lat, PA-C have reviewed all documentation for this visit. The documentation on 11/25/2023  for the exam, diagnosis, procedures, and orders are all accurate and complete.  Debera Lat, [facility], MMS [facility] [phone] (phone) [phone] (fax)  [facility]

## 2023-11-26 ENCOUNTER — Encounter: Payer: Self-pay | Admitting: Physician Assistant

## 2023-11-26 DIAGNOSIS — E669 Obesity, unspecified: Secondary | ICD-10-CM | POA: Diagnosis not present

## 2023-11-27 LAB — CBC WITH DIFFERENTIAL/PLATELET
Basophils Absolute: 0.1 10*3/uL (ref 0.0–0.2)
Basos: 1 %
EOS (ABSOLUTE): 0.3 10*3/uL (ref 0.0–0.4)
Eos: 4 %
Hematocrit: 43 % (ref 34.0–46.6)
Hemoglobin: 14.2 g/dL (ref 11.1–15.9)
Immature Grans (Abs): 0 10*3/uL (ref 0.0–0.1)
Immature Granulocytes: 0 %
Lymphocytes Absolute: 2.2 10*3/uL (ref 0.7–3.1)
Lymphs: 30 %
MCH: 25.9 pg — ABNORMAL LOW (ref 26.6–33.0)
MCHC: 33 g/dL (ref 31.5–35.7)
MCV: 79 fL (ref 79–97)
Monocytes Absolute: 0.5 10*3/uL (ref 0.1–0.9)
Monocytes: 7 %
Neutrophils Absolute: 4.2 10*3/uL (ref 1.4–7.0)
Neutrophils: 58 %
Platelets: 319 10*3/uL (ref 150–450)
RBC: 5.48 x10E6/uL — ABNORMAL HIGH (ref 3.77–5.28)
RDW: 12.6 % (ref 11.7–15.4)
WBC: 7.3 10*3/uL (ref 3.4–10.8)

## 2023-11-27 LAB — LIPID PANEL
Chol/HDL Ratio: 4.2 {ratio} (ref 0.0–4.4)
Cholesterol, Total: 196 mg/dL (ref 100–199)
HDL: 47 mg/dL (ref >39)
LDL Chol Calc (NIH): 126 mg/dL — ABNORMAL HIGH (ref 0–99)
Triglycerides: 126 mg/dL (ref 0–149)
VLDL Cholesterol Cal: 23 mg/dL (ref 5–40)

## 2023-11-27 LAB — TSH: TSH: 1.84 u[IU]/mL (ref 0.450–4.500)

## 2023-11-27 LAB — COMPREHENSIVE METABOLIC PANEL
ALT: 12 [IU]/L (ref 0–32)
AST: 14 [IU]/L (ref 0–40)
Albumin: 4.5 g/dL (ref 3.9–4.9)
Alkaline Phosphatase: 119 [IU]/L (ref 44–121)
BUN/Creatinine Ratio: 13 (ref 12–28)
BUN: 11 mg/dL (ref 8–27)
Bilirubin Total: 0.5 mg/dL (ref 0.0–1.2)
CO2: 20 mmol/L (ref 20–29)
Calcium: 10 mg/dL (ref 8.7–10.3)
Chloride: 103 mmol/L (ref 96–106)
Creatinine, Ser: 0.83 mg/dL (ref 0.57–1.00)
Globulin, Total: 2.8 g/dL (ref 1.5–4.5)
Glucose: 109 mg/dL — ABNORMAL HIGH (ref 70–99)
Potassium: 4 mmol/L (ref 3.5–5.2)
Sodium: 143 mmol/L (ref 134–144)
Total Protein: 7.3 g/dL (ref 6.0–8.5)
eGFR: 79 mL/min/{1.73_m2} (ref >59)

## 2023-11-27 LAB — HEMOGLOBIN A1C
Est. average glucose Bld gHb Est-mCnc: 148 mg/dL
Hgb A1c MFr Bld: 6.8 % — ABNORMAL HIGH (ref 4.8–5.6)

## 2023-11-28 ENCOUNTER — Encounter: Payer: Self-pay | Admitting: Physician Assistant

## 2023-11-28 NOTE — Progress Notes (Signed)
 If patient agrees Prescription for metformin 500 Mg once daily, with breakfast, #90, refill 0 Prescription for rosuvastatin 5 Mg once daily at bedtime, #90, refill 0

## 2023-12-02 ENCOUNTER — Encounter: Payer: Self-pay | Admitting: Physician Assistant

## 2023-12-02 DIAGNOSIS — E119 Type 2 diabetes mellitus without complications: Secondary | ICD-10-CM

## 2023-12-06 MED ORDER — RYBELSUS 3 MG PO TABS: 3.0000 mg | ORAL_TABLET | Freq: Every day | ORAL | 1 refills | Status: DC

## 2023-12-07 ENCOUNTER — Telehealth: Payer: Self-pay | Admitting: Physician Assistant

## 2023-12-07 ENCOUNTER — Other Ambulatory Visit (HOSPITAL_COMMUNITY): Payer: Self-pay

## 2023-12-07 ENCOUNTER — Other Ambulatory Visit: Payer: Self-pay | Admitting: Physician Assistant

## 2023-12-07 DIAGNOSIS — E119 Type 2 diabetes mellitus without complications: Secondary | ICD-10-CM

## 2023-12-07 DIAGNOSIS — G8929 Other chronic pain: Secondary | ICD-10-CM

## 2023-12-07 NOTE — Telephone Encounter (Signed)
 Covermymeds is requesting prior authorization Key: [id number] Rybelsus 3 MG Tablets

## 2023-12-08 NOTE — Telephone Encounter (Signed)
 Refilled 11/25/23. Requested Prescriptions  Refused Prescriptions Disp Refills   celecoxib (CELEBREX) 200 MG capsule [Pharmacy Med Name: CELECOXIB 200 MG CAPSULE] 30 capsule 0    Sig: TAKE 1 CAPSULE BY MOUTH TWICE A Vitiello     Analgesics:  COX2 Inhibitors Failed - 12/08/2023 12:13 PM      Failed - Manual Review: Labs are only required if the patient has taken medication for more than 8 weeks.      Passed - HGB in normal range and within 360 days    Hemoglobin  Date Value Ref Range Status  11/26/2023 14.2 11.1 - 15.9 g/dL Final         Passed - Cr in normal range and within 360 days    Creatinine, Ser  Date Value Ref Range Status  11/26/2023 0.83 0.57 - 1.00 mg/dL Final         Passed - HCT in normal range and within 360 days    Hematocrit  Date Value Ref Range Status  11/26/2023 43.0 34.0 - 46.6 % Final         Passed - AST in normal range and within 360 days    AST  Date Value Ref Range Status  11/26/2023 14 0 - 40 IU/L Final         Passed - ALT in normal range and within 360 days    ALT  Date Value Ref Range Status  11/26/2023 12 0 - 32 IU/L Final         Passed - eGFR is 30 or above and within 360 days    GFR calc Af Amer  Date Value Ref Range Status  04/17/2018 >60 >60 mL/min Final    Comment:    (NOTE) The eGFR has been calculated using the CKD EPI equation. This calculation has not been validated in all clinical situations. eGFR's persistently <60 mL/min signify possible Chronic Kidney Disease.    GFR, Estimated  Date Value Ref Range Status  07/09/2022 >60 >60 mL/min Final    Comment:    (NOTE) Calculated using the CKD-EPI Creatinine Equation (2021)    eGFR  Date Value Ref Range Status  11/26/2023 79 >59 mL/min/1.73 Final         Passed - Patient is not pregnant      Passed - Valid encounter within last 12 months    Recent Outpatient Visits           6 months ago Chronic hip pain, left   [facility] [place], [place],  PA-C   8 months ago Rash   [facility] [place], [place], PA-C   1 year ago Chronic hip pain, bilateral    [facility] Alfredia Ferguson, PA-C   1 year ago Chronic bilateral low back pain with bilateral sciatica   [facility] Alfredia Ferguson, PA-C   2 years ago Encounter for health maintenance examination   [facility] Alfredia Ferguson, [place]

## 2023-12-10 DIAGNOSIS — E119 Type 2 diabetes mellitus without complications: Secondary | ICD-10-CM | POA: Insufficient documentation

## 2023-12-24 ENCOUNTER — Other Ambulatory Visit: Payer: Self-pay | Admitting: Physician Assistant

## 2023-12-24 DIAGNOSIS — G8929 Other chronic pain: Secondary | ICD-10-CM

## 2023-12-27 NOTE — Telephone Encounter (Signed)
 Requested Prescriptions  Pending Prescriptions Disp Refills   celecoxib (CELEBREX) 200 MG capsule [Pharmacy Med Name: CELECOXIB 200 MG CAPSULE] 30 capsule 0    Sig: TAKE 1 CAPSULE BY MOUTH TWICE A Maiers     Analgesics:  COX2 Inhibitors Failed - 12/27/2023 11:58 AM      Failed - Manual Review: Labs are only required if the patient has taken medication for more than 8 weeks.      Passed - HGB in normal range and within 360 days    Hemoglobin  Date Value Ref Range Status  11/26/2023 14.2 11.1 - 15.9 g/dL Final         Passed - Cr in normal range and within 360 days    Creatinine, Ser  Date Value Ref Range Status  11/26/2023 0.83 0.57 - 1.00 mg/dL Final         Passed - HCT in normal range and within 360 days    Hematocrit  Date Value Ref Range Status  11/26/2023 43.0 34.0 - 46.6 % Final         Passed - AST in normal range and within 360 days    AST  Date Value Ref Range Status  11/26/2023 14 0 - 40 IU/L Final         Passed - ALT in normal range and within 360 days    ALT  Date Value Ref Range Status  11/26/2023 12 0 - 32 IU/L Final         Passed - eGFR is 30 or above and within 360 days    GFR calc Af Amer  Date Value Ref Range Status  04/17/2018 >60 >60 mL/min Final    Comment:    (NOTE) The eGFR has been calculated using the CKD EPI equation. This calculation has not been validated in all clinical situations. eGFR's persistently <60 mL/min signify possible Chronic Kidney Disease.    GFR, Estimated  Date Value Ref Range Status  07/09/2022 >60 >60 mL/min Final    Comment:    (NOTE) Calculated using the CKD-EPI Creatinine Equation (2021)    eGFR  Date Value Ref Range Status  11/26/2023 79 >59 mL/min/1.73 Final         Passed - Patient is not pregnant      Passed - Valid encounter within last 12 months    Recent Outpatient Visits           7 months ago Chronic hip pain, left   [place] [facility] [place], [place], PA-C   9 months ago  [place]   [place] [facility] [place], [place], PA-C   1 year ago Chronic hip pain, bilateral   [place] [facility] Alfred[place], PA-C   1 year ago Chronic bilateral low back pain with bilateral sciatica   [facility] Alfredia Ferguson, PA-C   2 years ago Encounter for health maintenance examination   [facility] Alfredia Ferguson, [place]

## 2023-12-30 ENCOUNTER — Telehealth: Payer: Self-pay | Admitting: Pharmacy Technician

## 2023-12-30 ENCOUNTER — Other Ambulatory Visit (HOSPITAL_COMMUNITY): Payer: Self-pay

## 2023-12-30 NOTE — Telephone Encounter (Signed)
 Pharmacy Patient Advocate Encounter   Received notification from CoverMyMeds that prior authorization for Rybelsus 3MG  tablets is required/requested.   Insurance verification completed.   The patient is insured through [facility] .   Per test claim: PA required; PA submitted to above mentioned insurance via CoverMyMeds Key/confirmation #/EOC [id number] Status is pending

## 2023-12-31 DIAGNOSIS — E119 Type 2 diabetes mellitus without complications: Secondary | ICD-10-CM | POA: Diagnosis not present

## 2023-12-31 DIAGNOSIS — M1612 Unilateral primary osteoarthritis, left hip: Secondary | ICD-10-CM | POA: Diagnosis not present

## 2023-12-31 NOTE — Telephone Encounter (Signed)
 Pharmacy Patient Advocate Encounter  Received notification from [facility] that Prior Authorization for Rybelsus 3MG  tablets has been DENIED.  No reason given; No denial letter received via Fax or CMM. It has been requested and will be uploaded to the media tab once received.   PA #/Case ID/Reference #: Key: [id number]

## 2024-01-03 ENCOUNTER — Encounter: Payer: Self-pay | Admitting: Physician Assistant

## 2024-01-05 DIAGNOSIS — M1612 Unilateral primary osteoarthritis, left hip: Secondary | ICD-10-CM | POA: Diagnosis not present

## 2024-01-15 ENCOUNTER — Other Ambulatory Visit: Payer: Self-pay | Admitting: Physician Assistant

## 2024-01-15 DIAGNOSIS — G8929 Other chronic pain: Secondary | ICD-10-CM

## 2024-01-17 NOTE — Telephone Encounter (Signed)
 Requested Prescriptions  Pending Prescriptions Disp Refills   gabapentin (NEURONTIN) 300 MG capsule [Pharmacy Med Name: GABAPENTIN 300 MG CAPSULE] 90 capsule 1    Sig: TAKE 1 CAPSULE BY MOUTH DAILY AT BEDTIME     Neurology: Anticonvulsants - gabapentin Failed - 01/17/2024  3:17 PM      Failed - Valid encounter within last 12 months    Recent Outpatient Visits           1 month ago Obesity (BMI 30-39.9)   [facility] [place], [place], [place]              Passed - Cr in normal range and within 360 days    Creatinine, Ser  Date Value Ref Range Status  11/26/2023 0.83 0.57 - 1.00 mg/dL Final         Passed - Completed PHQ-2 or PHQ-9 in the last 360 days

## 2024-01-26 DIAGNOSIS — M25552 Pain in left hip: Secondary | ICD-10-CM | POA: Diagnosis not present

## 2024-02-13 ENCOUNTER — Other Ambulatory Visit: Payer: Self-pay | Admitting: Physician Assistant

## 2024-02-13 DIAGNOSIS — G8929 Other chronic pain: Secondary | ICD-10-CM

## 2024-03-10 ENCOUNTER — Other Ambulatory Visit: Payer: Self-pay | Admitting: Physician Assistant

## 2024-03-10 DIAGNOSIS — I10 Essential (primary) hypertension: Secondary | ICD-10-CM

## 2024-03-10 DIAGNOSIS — G8929 Other chronic pain: Secondary | ICD-10-CM

## 2024-04-14 NOTE — Discharge Instructions (Signed)
 Instructions after Total Hip Replacement   Tanvi Gatling P. Angie Fava., M.D.    Dept. of Orthopaedics & Sports Medicine [facility] [address] [place], [place]  [address]  Phone: [phone]   Fax: [phone]        [email or web address]        DIET: Drink plenty of non-alcoholic fluids. Resume your normal diet. Include foods high in fiber.  ACTIVITY:  You may use crutches or a walker with weight-bearing as tolerated, unless instructed otherwise. You may be weaned off of the walker or crutches by your Physical Therapist.  Do NOT reach below the level of your knees or cross your legs until allowed.    Continue doing gentle exercises. Exercising will reduce the pain and swelling, increase motion, and prevent muscle weakness.   Please continue to use the TED compression stockings for 6 weeks. You may remove the stockings at night, but should reapply them in the morning. Do not drive or operate any equipment until instructed.  WOUND CARE:  Continue to use ice packs periodically to reduce pain and swelling. The initial dressing (Aquacel) can remain in place for 7 days (see separate instructions). Keep the incision clean and dry. You may bathe or shower after the staples are removed at the first office visit following surgery.  MEDICATIONS: You may resume your regular medications. Please take the pain medication as prescribed on the medication. Do not take pain medication on an empty stomach. Unless instructed otherwise, you should take an enteric-coated aspirin 81 mg. TWICE a day. (This along with elevation will help reduce the possibility of blood clots/phlebitis in your operated leg.) Use a stool softener (such as Senokot-S or Colace) daily and a laxative (such as Miralax or Dulcolax) as needed to prevent constipation.  Do not drive or drink alcoholic beverages when taking pain medications.  CALL THE OFFICE FOR: Temperature above 101 degrees Excessive bleeding or drainage  on the dressing. Excessive swelling, coldness, or paleness of the toes. Persistent nausea and vomiting.  FOLLOW-UP:  You should have an appointment to return to the office in 6 weeks after surgery. Arrangements have been made for continuation of Physical Therapy (either home therapy or outpatient therapy).     [facility] Department Directory         [email or web address]       [email or web address]          Cardiology  Appointments: [place] - [phone]  Endocrinology  Appointments: [place] [phone] [place] - [phone]  Gastroenterology  Appointments: [place] [phone] [place] - [phone]        General Surgery   Appointments: [facility]  Internal Medicine/Family Medicine  Appointments: [facility] [place] - [phone] [place] - [phone]  Metabolic and Weigh Loss Surgery  Appointments: [facility]        Neurology  Appointments: [place] [phone] [place] - [phone]  Neurosurgery  Appointments: [place]  Obstetrics & Gynecology  Appointments: [place] [phone] [place] - [phone]        Pediatrics  Appointments: Sherrie Sport [phone] [place] - [phone]  Physiatry  Appointments: [place] [phone]  Physical Therapy  Appointments: [place] - [phone]        Podiatry  Appointments: [place] [phone] [place] - [phone]  Pulmonology  Appointments: [place]  Rheumatology  Appointments: [place] [phone]        [place] Location: [facility]  [address] [place], [place]  [address]  [place]  Location: [facility] [address] [place], [place]  [address]  [place] Location: [facility] [address] [place], [place]  [address]

## 2024-04-17 DIAGNOSIS — M1612 Unilateral primary osteoarthritis, left hip: Secondary | ICD-10-CM | POA: Diagnosis not present

## 2024-04-17 NOTE — H&P (View-Only) (Signed)
 NAME: Jamani Rochin  H&P Date: 04/17/2024   Procedure Date: 04/24/2024  Chief Complaint: left hip pain  HPI  Dula Rathmann is a 63 y.o. female who has severe Left hip pain.  Patient reports a longstanding history of left hip and groin pain.  She states that her pain localizes to the anterior portion of her groin and occasionally into the posterior region.  She states that it is made worse with any weightbearing, rotation of the hip or ambulation.  She does report that she has noticed worsening motion capabilities at this left hip.  It greatly impacts her ability to ambulate long distances and perform her ADLs that she would like. She has failed conservative treatment including NSAID's, opioid medications Tylenol  and activity modification.  She is currently utilizing a cane for ambulation assistance.   She has requested operative intervention for relief of her DJD symptoms.  She denies any previous cardiac or pulmonary history.  No previous DVTs or clots.  Patient is a diabetic, her last A1c was at 6.8.  Denies having any previous surgeries on this left hip.  Of note she does have a history of a right total hip arthroplasty performed by Dr. Beola.  Social Hx: Patient states that she lives at home alone.  She works as a Careers adviser at [employer]  She denies any alcohol use, illicit drug use, nicotine use or smoking.   Medications & Allergies  Allergies: No Known Allergies  Home Medicines: Current Outpatient Medications on File Prior to Visit  Medication Sig Dispense Refill  . acetaminophen  (TYLENOL ) 650 MG ER tablet Take 1,300 mg by mouth once daily as needed for Pain    . acetaminophen  (TYLENOL ) 650 MG ER tablet Take 1,300 mg by mouth every 8 (eight) hours as needed for Pain    . celecoxib  (CELEBREX ) 100 MG capsule Take 100 mg by mouth 2 (two) times daily    . celecoxib  (CELEBREX ) 200 MG capsule Take 200 mg by mouth 2 (two) times daily    . famotidine  (PEPCID ) 20 MG tablet Take 20 mg by mouth 2 (two) times  daily    . gabapentin  (NEURONTIN ) 300 MG capsule Take 1 capsule by mouth at bedtime    . lisinopril -hydrochlorothiazide  (PRINZIDE ,ZESTORETIC ) 20-25 mg tablet Take 1 tablet by mouth once daily.    . meloxicam  (MOBIC ) 15 MG tablet Take 1 tablet (15 mg total) by mouth once daily (Patient taking differently: Take 15 mg by mouth once daily as needed 12/31/23-Instructed not to take when taking celebrex .) 30 tablet 0  . tiZANidine (ZANAFLEX) 4 MG tablet Take 4 mg by mouth every 6 (six) hours as needed     No current facility-administered medications on file prior to visit.    Medical / Surgical History   Past Medical History:  Diagnosis Date  . Acid reflux 09/24/2006  . DDD (degenerative disc disease), lumbar 01/28/2016  . Essential (primary) hypertension 11/11/2001  . Genital herpes   . GERD (gastroesophageal reflux disease)   . HTN (hypertension)   . Insomnia 02/21/2016     Past Surgical History:  Procedure Laterality Date  . COLONOSCOPY N/A 01/20/2007   Dr. CHARM Punch @ [facility] - Hyperplastic Polyps, [facility](f): CBF 01/2012  . EGD  01/20/2007   Normal  . COLONOSCOPY N/A 09/18/2016   Dr. FABIENE Holmes @ [facility] - Hyperplastic Polyp, [facility](f): CBF 09/2021  (04/20/22 [email or web address])  . Right total hip arthroplasty  07/21/2022   Dr Leora, [facility]  . COLONOSCOPY  12/14/2022  FHx CC/Repeat 61yrs/TKT     Physical Exam   Ht:167.6 cm (5' 6) Wt:(!) 105.7 kg (233 lb) BMI: Body mass index is 37.61 kg/m.  General/Constitutional: No apparent distress: well-nourished and well developed. Eyes: Pupils equal, round with synchronous movement. Lymphatic: No palpable adenopathy. Respiratory: Patient has good chest rise and fall with inspiration and expiration.  All lung fields are clear to auscultation bilaterally.  There is no Rales, rhonchi or wheezes appreciated. Cardiovascular: Upon auscultation there is a regular rate and rhythm without any murmurs, rubs, gallops or heaves appreciated.  There does not appear  to be any swelling down the lower extremities.  Posterior tibial pulses appreciated bilaterally, 2+. Integumentary: No impressive skin lesions present, except as noted in detailed exam. Neuro/Psych: Normal mood and affect, oriented to person, place and time. Musculoskeletal: see exam below  Left hip exam Left Hip:  Upon inspection of the patient's left hip, there does not appear to be any redness, swelling or previous incisions.  No gross deformities appreciated.   Pelvic tilt:  Negative  Limb lengths:  Equal with the patient standing  Soft tissue swelling: Negative  Erythema:  Negative  Crepitance:  Negative  Tenderness:  Greater trochanter is nontender to palpation. Mild to moderate pain is elicited by axial compression or extremes of rotation.  Atrophy:  No atrophy. Fair to good hip flexor and abductor strength.  Range of Motion:  EXT/FLEX: 0/90    ADD/ABD: -/20    IR/ER: 0/15   Patient is neurovascularly intact to all dermatomes extending down there Left lower extremity to all dermatomes.  Posterior tibial pulses were appreciated, 2+.  Imaging  Hip Imaging: A series of x-rays were ordered and interpreted of the patient's left hip.  These images included AP pelvis, AP and lateral views of the left hip.  Upon inspection, there is significant narrowing of the femoral acetabular joint.  Complete bone-on-bone articulation noted.  Subchondral changes appreciated.  Osteophyte formation is present.  Early signs of protrusio apparent.  No fractures present.  Assesment and Plan  Hip DJD  I have recommended that Brelyn Fromme undergo left total hip replacement.  Consents has been signed.  The risks, benefits, prognosis and alternatives including but not limited to DVT, PE, infection, neurovascular injury, failure of the procedure and death were explained to the patient and she is willing to proceed with surgery as described to her by myself.  Plan will be for post operative admission of at least 1  midnight for pain control and PT.  She will be managed with DVT prophylaxis, antibiotics preoperatively for 24 hours and aggressive in patient rehab.  Pre, intra and post op interventions were discussed. Patient has good understanding   Medication Reconciliation was performed. Discussed cessation of NSAIDs, vitamins and supplements.    A total of 40 minutes was spent reviewing patient's charts, medical reconciliation, discussing/educating the patient about surgical interventions, and answering any questions provided by the patient.  JOSHUA DALLAS KOYANAGI, [place] [facility] 04/17/2024

## 2024-04-18 ENCOUNTER — Encounter
Admission: RE | Admit: 2024-04-18 | Discharge: 2024-04-18 | Disposition: A | Discharge status: Home / Self Care | Source: Ambulatory Visit | Attending: Orthopedic Surgery | Admitting: Orthopedic Surgery

## 2024-04-18 ENCOUNTER — Other Ambulatory Visit: Payer: Self-pay

## 2024-04-18 DIAGNOSIS — E119 Type 2 diabetes mellitus without complications: Secondary | ICD-10-CM | POA: Insufficient documentation

## 2024-04-18 DIAGNOSIS — Z01810 Encounter for preprocedural cardiovascular examination: Secondary | ICD-10-CM | POA: Diagnosis not present

## 2024-04-18 DIAGNOSIS — M1612 Unilateral primary osteoarthritis, left hip: Secondary | ICD-10-CM | POA: Insufficient documentation

## 2024-04-18 DIAGNOSIS — Z01818 Encounter for other preprocedural examination: Secondary | ICD-10-CM | POA: Insufficient documentation

## 2024-04-18 HISTORY — DX: Type 2 diabetes mellitus without complications: E11.9

## 2024-04-18 LAB — COMPREHENSIVE METABOLIC PANEL WITH GFR
ALT: 12 U/L (ref 0–44)
AST: 18 U/L (ref 15–41)
Albumin: 3.8 g/dL (ref 3.5–5.0)
Alkaline Phosphatase: 85 U/L (ref 38–126)
Anion gap: 11 (ref 5–15)
BUN: 16 mg/dL (ref 8–23)
CO2: 25 mmol/L (ref 22–32)
Calcium: 9.4 mg/dL (ref 8.9–10.3)
Chloride: 102 mmol/L (ref 98–111)
Creatinine, Ser: 0.84 mg/dL (ref 0.44–1.00)
GFR, Estimated: >60 mL/min (ref >60)
Glucose, Bld: 173 mg/dL — ABNORMAL HIGH (ref 70–99)
Potassium: 3.2 mmol/L — ABNORMAL LOW (ref 3.5–5.1)
Sodium: 138 mmol/L (ref 135–145)
Total Bilirubin: 1 mg/dL (ref 0.0–1.2)
Total Protein: 6.7 g/dL (ref 6.5–8.1)

## 2024-04-18 LAB — CBC WITH DIFFERENTIAL/PLATELET
Abs Immature Granulocytes: 0.02 K/uL (ref 0.00–0.07)
Basophils Absolute: 0 K/uL (ref 0.0–0.1)
Basophils Relative: 1 %
Eosinophils Absolute: 0.3 K/uL (ref 0.0–0.5)
Eosinophils Relative: 4 %
HCT: 42.9 % (ref 36.0–46.0)
Hemoglobin: 13.6 g/dL (ref 12.0–15.0)
Immature Granulocytes: 0 %
Lymphocytes Relative: 25 %
Lymphs Abs: 1.6 K/uL (ref 0.7–4.0)
MCH: 25.5 pg — ABNORMAL LOW (ref 26.0–34.0)
MCHC: 31.7 g/dL (ref 30.0–36.0)
MCV: 80.5 fL (ref 80.0–100.0)
Monocytes Absolute: 0.4 K/uL (ref 0.1–1.0)
Monocytes Relative: 7 %
Neutro Abs: 4 K/uL (ref 1.7–7.7)
Neutrophils Relative %: 63 %
Platelets: 278 K/uL (ref 150–400)
RBC: 5.33 MIL/uL — ABNORMAL HIGH (ref 3.87–5.11)
RDW: 12.5 % (ref 11.5–15.5)
WBC: 6.3 K/uL (ref 4.0–10.5)
nRBC: 0 % (ref 0.0–0.2)

## 2024-04-18 LAB — TYPE AND SCREEN
ABO/RH(D): O NEG
Antibody Screen: NEGATIVE

## 2024-04-18 LAB — URINALYSIS, ROUTINE W REFLEX MICROSCOPIC
Bilirubin Urine: NEGATIVE
Glucose, UA: NEGATIVE mg/dL
Hgb urine dipstick: NEGATIVE
Ketones, ur: 5 mg/dL — AB
Leukocytes,Ua: NEGATIVE
Nitrite: NEGATIVE
Protein, ur: NEGATIVE mg/dL
Specific Gravity, Urine: 1.027 (ref 1.005–1.030)
pH: 5 (ref 5.0–8.0)

## 2024-04-18 LAB — HEMOGLOBIN A1C
Hgb A1c MFr Bld: 6.4 % — ABNORMAL HIGH (ref 4.8–5.6)
Mean Plasma Glucose: 136.98 mg/dL

## 2024-04-18 LAB — SEDIMENTATION RATE: Sed Rate: 8 mm/h (ref 0–30)

## 2024-04-18 LAB — SURGICAL PCR SCREEN
MRSA, PCR: NEGATIVE
Staphylococcus aureus: NEGATIVE

## 2024-04-18 LAB — C-REACTIVE PROTEIN: CRP: 0.7 mg/dL (ref <1.0)

## 2024-04-18 NOTE — Patient Instructions (Addendum)
 Your procedure is scheduled on: Monday 04/24/24 Report to the Registration Desk on the 1st floor of the [facility]. To find out your arrival time, please call [phone] between 1PM - 3PM on: Friday 04/21/24 If your arrival time is 6:00 am, do not arrive before that time as the [facility] entrance doors do not open until 6:00 am.  REMEMBER: Instructions that are not followed completely may result in serious medical risk, up to and including death; or upon the discretion of your surgeon and anesthesiologist your surgery may need to be rescheduled.  Do not eat food after midnight the night before surgery.  No gum chewing or hard candies.  You may however, drink CLEAR liquids up to 2 hours before you are scheduled to arrive for your surgery. Do not drink anything within 2 hours of your scheduled arrival time.  Clear liquids include: - water   **Type 1 and Type 2 diabetics should only drink water.**  In addition, your doctor has ordered for you to drink the provided:  Gatorade G2 Drinking this carbohydrate drink up to two hours before surgery helps to reduce insulin resistance and improve patient outcomes. Please complete drinking 2 hours before scheduled arrival time.  One week prior to surgery: Stop Anti-inflammatories (NSAIDS) such as Advil, Aleve , Ibuprofen, Motrin, Naproxen , Naprosyn  and Aspirin  based products such as Excedrin, Goody's Powder, BC Powder. You may however, continue to take Tylenol  if needed for pain up until the Abts of surgery  Stop ALL OVER THE COUNTER supplements and vitamins until after surgery.  **Follow guidelines for insulin and diabetes medications.**  **Follow recommendations regarding stopping blood thinners.**  Continue taking all of your other prescription medications up until the V[place] of surgery.  ON THE [place] OF SURGERY ONLY TAKE THESE MEDICATIONS WITH SIPS OF WATER:  1.famonidine   No Alcohol for 24 hours before or after surgery.  No Smoking  including e-cigarettes for 24 hours before surgery.  No chewable tobacco products for at least 6 hours before surgery.  No nicotine patches on the [place] of surgery.  Do not use any recreational drugs for at least a week (preferably 2 weeks) before your surgery.  Please be advised that the combination of cocaine and anesthesia may have negative outcomes, up to and including death. If you test positive for cocaine, your surgery will be cancelled.  On the morning of surgery brush your teeth with toothpaste and water, you may rinse your mouth with mouthwash if you wish. Do not swallow any toothpaste or mouthwash.  Use CHG Soap or wipes as directed on instruction sheet.  Do not wear lotions, powders, or perfumes on the Gosh[place] of surgery  Do not shave body hair from the neck down 48 hours before surgery.  Do not wear jewelry, make-up, hairpins, clips or toenail polish.  For welded (permanent) jewelry: bracelets, anklets, waist bands, etc.  Please have this removed prior to surgery.  If it is not removed, there is a chance that hospital personnel will need to cut it off on the Lyman of surgery.  Contact lenses, hearing aids and dentures may not be worn into surgery.  Do not bring valuables to the hospital. [facility] is not responsible for any missing/lost belongings or valuables.    Notify your doctor if there is any change in your medical condition (cold, fever, infection).  Wear comfortable clothing (specific to your surgery type) to the hospital.  After surgery, you can help prevent lung complications by doing breathing exercises.  Take deep breaths and cough every 1-2 hours. Your doctor may order a device called an Incentive Spirometer to help you take deep breaths.  If you are being admitted to the hospital overnight, leave your suitcase in the car. After surgery it may be brought to your room.  In case of increased patient census, it may be necessary for you, the patient, to continue  your postoperative care in the [facility] department.  Please call the Pre-admissions Testing Dept. at [phone] if you have any questions about these instructions.  Surgery Visitation Policy:  Patients having surgery or a procedure may have two visitors.  Children under the age of 47 must have an adult with them who is not the patient.  Inpatient Visitation:    Visiting hours are 7 a.m. to 8 p.m. Up to four visitors are allowed at one time in a patient room. The visitors may rotate out with other people during the Garber.  One visitor age 41 or older may stay with the patient overnight and must be in the room by 8 p.m.   Merchandiser, retail to address health-related social needs:  [email or web address]    Pre-operative 5 CHG Bath Instructions   You can play a key role in reducing the risk of infection after surgery. Your skin needs to be as free of germs as possible. You can reduce the number of germs on your skin by washing with CHG (chlorhexidine  gluconate) soap before surgery. CHG is an antiseptic soap that kills germs and continues to kill germs even after washing.   DO NOT use if you have an allergy to chlorhexidine /CHG or antibacterial soaps. If your skin becomes reddened or irritated, stop using the CHG and notify one of our RNs at [phone].   Please shower with the CHG soap starting 4 days before surgery using the following schedule:   Thursday 04/20/24 - Monday 04/24/24    Please keep in mind the following:  DO NOT shave, including legs and underarms, starting the Dyk of your first shower.   You may shave your face at any point before/[place] of surgery.  Place clean sheets on your bed the Teas you start using CHG soap. Use a clean washcloth (not used since being washed) for each shower. DO NOT sleep with pets once you start using the CHG.   CHG Shower Instructions:  If you choose to wash your hair and private area, wash first with your  normal shampoo/soap.  After you use shampoo/soap, rinse your hair and body thoroughly to remove shampoo/soap residue.  Turn the water OFF and apply about 3 tablespoons (45 ml) of CHG soap to a CLEAN washcloth.  Apply CHG soap ONLY FROM YOUR NECK DOWN TO YOUR TOES (washing for 3-5 minutes)  DO NOT use CHG soap on face, private areas, open wounds, or sores.  Pay special attention to the area where your surgery is being performed.  If you are having back surgery, having someone wash your back for you may be helpful. Wait 2 minutes after CHG soap is applied, then you may rinse off the CHG soap.  Pat dry with a clean towel  Put on clean clothes/pajamas   If you choose to wear lotion, please use ONLY the CHG-compatible lotions on the back of this paper.     Additional instructions for the Gjerde of surgery: DO NOT APPLY any lotions, deodorants, cologne, or perfumes.   Put on clean/comfortable clothes.  Brush your teeth.  Ask your nurse  before applying any prescription medications to the skin.      CHG Compatible Lotions   Aveeno Moisturizing lotion  Cetaphil Moisturizing Cream  Cetaphil Moisturizing Lotion  Clairol Herbal Essence Moisturizing Lotion, Dry Skin  Clairol Herbal Essence Moisturizing Lotion, Extra Dry Skin  Clairol Herbal Essence Moisturizing Lotion, Normal Skin  Curel Age Defying Therapeutic Moisturizing Lotion with Alpha Hydroxy  Curel Extreme Care Body Lotion  Curel Soothing Hands Moisturizing Hand Lotion  Curel Therapeutic Moisturizing Cream, Fragrance-Free  Curel Therapeutic Moisturizing Lotion, Fragrance-Free  Curel Therapeutic Moisturizing Lotion, Original Formula  Eucerin Daily Replenishing Lotion  Eucerin Dry Skin Therapy Plus Alpha Hydroxy Crme  Eucerin Dry Skin Therapy Plus Alpha Hydroxy Lotion  Eucerin Original Crme  Eucerin Original Lotion  Eucerin Plus Crme Eucerin Plus Lotion  Eucerin TriLipid Replenishing Lotion  Keri Anti-Bacterial Hand Lotion  Keri  Deep Conditioning Original Lotion Dry Skin Formula Softly Scented  Keri Deep Conditioning Original Lotion, Fragrance Free Sensitive Skin Formula  Keri Lotion Fast Absorbing Fragrance Free Sensitive Skin Formula  Keri Lotion Fast Absorbing Softly Scented Dry Skin Formula  Keri Original Lotion  Keri Skin Renewal Lotion Keri Silky Smooth Lotion  Keri Silky Smooth Sensitive Skin Lotion  Nivea Body Creamy Conditioning Oil  Nivea Body Extra Enriched Lotion  Nivea Body Original Lotion  Nivea Body Sheer Moisturizing Lotion Nivea Crme  Nivea Skin Firming Lotion  NutraDerm 30 Skin Lotion  NutraDerm Skin Lotion  NutraDerm Therapeutic Skin Cream  NutraDerm Therapeutic Skin Lotion  ProShield Protective Hand Cream  Provon moisturizing lotion  How to Use an Incentive Spirometer  An incentive spirometer is a tool that measures how well you are filling your lungs with each breath. Learning to take long, deep breaths using this tool can help you keep your lungs clear and active. This may help to reverse or lessen your chance of developing breathing (pulmonary) problems, especially infection. You may be asked to use a spirometer: After a surgery. If you have a lung problem or a history of smoking. After a long period of time when you have been unable to move or be active. If the spirometer includes an indicator to show the highest number that you have reached, your health care provider or respiratory therapist will help you set a goal. Keep a log of your progress as told by your health care provider. What are the risks? Breathing too quickly may cause dizziness or cause you to pass out. Take your time so you do not get dizzy or light-headed. If you are in pain, you may need to take pain medicine before doing incentive spirometry. It is harder to take a deep breath if you are having pain.   Sit up on the edge of your bed or on a chair. Hold the incentive spirometer so that it is in an upright  position. Before you use the spirometer, breathe out normally. Place the mouthpiece in your mouth. Make sure your lips are closed tightly around it. Breathe in slowly and as deeply as you can through your mouth, causing the piston or the ball to rise toward the top of the chamber. Hold your breath for 3-5 seconds, or for as long as possible. If the spirometer includes a coach indicator, use this to guide you in breathing. Slow down your breathing if the indicator goes above the marked areas. Remove the mouthpiece from your mouth and breathe out normally. The piston or ball will return to the bottom of the chamber. Rest for a few  seconds, then repeat the steps 10 or more times. Take your time and take a few normal breaths between deep breaths so that you do not get dizzy or light-headed. Do this every 1-2 hours when you are awake. If the spirometer includes a goal marker to show the highest number you have reached (best effort), use this as a goal to work toward during each repetition. After each set of 10 deep breaths, cough a few times. This will help to make sure that your lungs are clear. If you have an incision on your chest or abdomen from surgery, place a pillow or a rolled-up towel firmly against the incision when you cough. This can help to reduce pain while taking deep breaths and coughing. General tips When you are able to get out of bed: Walk around often. Continue to take deep breaths and cough in order to clear your lungs. Keep using the incentive spirometer until your health care provider says it is okay to stop using it. If you have been in the hospital, you may be told to keep using the spirometer at home. Contact a health care provider if: You are having difficulty using the spirometer. You have trouble using the spirometer as often as instructed. Your pain medicine is not giving enough relief for you to use the spirometer as told. You have a fever. Get help right away  if: You develop shortness of breath. You develop a cough with bloody mucus from the lungs. You have fluid or blood coming from an incision site after you cough. Summary An incentive spirometer is a tool that can help you learn to take long, deep breaths to keep your lungs clear and active. You may be asked to use a spirometer after a surgery, if you have a lung problem or a history of smoking, or if you have been inactive for a long period of time. Use your incentive spirometer as instructed every 1-2 hours while you are awake. If you have an incision on your chest or abdomen, place a pillow or a rolled-up towel firmly against your incision when you cough. This will help to reduce pain. Get help right away if you have shortness of breath, you cough up bloody mucus, or blood comes from your incision when you cough. This information is not intended to replace advice given to you by your health care provider. Make sure you discuss any questions you have with your health care provider. Document Revised: 12/18/2019 Document Reviewed: 12/18/2019 Elsevier Patient Education  2023 [employer].

## 2024-04-23 ENCOUNTER — Encounter: Payer: Self-pay | Admitting: Orthopedic Surgery

## 2024-04-24 ENCOUNTER — Observation Stay
Admission: RE | Admit: 2024-04-24 | Discharge: 2024-04-26 | Disposition: A | Discharge status: Home / Self Care | Attending: Orthopedic Surgery | Admitting: Orthopedic Surgery

## 2024-04-24 ENCOUNTER — Other Ambulatory Visit: Payer: Self-pay

## 2024-04-24 ENCOUNTER — Observation Stay

## 2024-04-24 ENCOUNTER — Ambulatory Visit: Admitting: Certified Registered Nurse Anesthetist

## 2024-04-24 ENCOUNTER — Ambulatory Visit: Payer: Self-pay | Admitting: Urgent Care

## 2024-04-24 ENCOUNTER — Telehealth: Payer: Self-pay

## 2024-04-24 ENCOUNTER — Encounter: Payer: Self-pay | Admitting: Orthopedic Surgery

## 2024-04-24 ENCOUNTER — Encounter
Admission: RE | Disposition: A | Discharge status: Home / Self Care | Payer: Self-pay | Source: Home / Self Care | Attending: Orthopedic Surgery

## 2024-04-24 DIAGNOSIS — G8929 Other chronic pain: Secondary | ICD-10-CM

## 2024-04-24 DIAGNOSIS — I10 Essential (primary) hypertension: Secondary | ICD-10-CM | POA: Diagnosis not present

## 2024-04-24 DIAGNOSIS — E119 Type 2 diabetes mellitus without complications: Secondary | ICD-10-CM

## 2024-04-24 DIAGNOSIS — M1612 Unilateral primary osteoarthritis, left hip: Secondary | ICD-10-CM | POA: Diagnosis not present

## 2024-04-24 DIAGNOSIS — Z79899 Other long term (current) drug therapy: Secondary | ICD-10-CM | POA: Insufficient documentation

## 2024-04-24 DIAGNOSIS — Z96642 Presence of left artificial hip joint: Secondary | ICD-10-CM

## 2024-04-24 DIAGNOSIS — R7303 Prediabetes: Secondary | ICD-10-CM

## 2024-04-24 DIAGNOSIS — Z96643 Presence of artificial hip joint, bilateral: Secondary | ICD-10-CM | POA: Diagnosis not present

## 2024-04-24 DIAGNOSIS — Z01818 Encounter for other preprocedural examination: Secondary | ICD-10-CM

## 2024-04-24 HISTORY — PX: TOTAL HIP ARTHROPLASTY: SHX124

## 2024-04-24 LAB — GLUCOSE, CAPILLARY
Glucose-Capillary: 130 mg/dL — ABNORMAL HIGH (ref 70–99)
Glucose-Capillary: 174 mg/dL — ABNORMAL HIGH (ref 70–99)
Glucose-Capillary: 157 mg/dL — ABNORMAL HIGH (ref 70–99)
Glucose-Capillary: 126 mg/dL — ABNORMAL HIGH (ref 70–99)

## 2024-04-24 SURGERY — ARTHROPLASTY, HIP, TOTAL,POSTERIOR APPROACH
Anesthesia: Spinal | Site: Hip | Laterality: Left

## 2024-04-24 MED ORDER — HYDROCHLOROTHIAZIDE 25 MG PO TABS
25.0000 mg | ORAL_TABLET | Freq: Every day | ORAL | Status: DC
  Administered 2024-04-24 – 2024-04-26 (×2): 25 mg via ORAL
  Filled 2024-04-24 (×2): qty 1

## 2024-04-24 MED ORDER — OXYCODONE HCL 5 MG PO TABS
5.0000 mg | ORAL_TABLET | Freq: Once | ORAL | Status: AC | PRN
  Administered 2024-04-24: 5 mg via ORAL

## 2024-04-24 MED ORDER — SODIUM CHLORIDE 0.9 % IR SOLN
Status: DC | PRN
  Administered 2024-04-24: 3000 mL

## 2024-04-24 MED ORDER — LISINOPRIL 20 MG PO TABS
20.0000 mg | ORAL_TABLET | Freq: Every day | ORAL | Status: DC
  Administered 2024-04-24 – 2024-04-26 (×2): 20 mg via ORAL

## 2024-04-24 MED ORDER — EPHEDRINE 5 MG/ML INJ
INTRAVENOUS | Status: AC
  Filled 2024-04-24: qty 5

## 2024-04-24 MED ORDER — ASPIRIN 81 MG PO CHEW
81.0000 mg | CHEWABLE_TABLET | Freq: Two times a day (BID) | ORAL | Status: DC
  Administered 2024-04-24 – 2024-04-26 (×5): 81 mg via ORAL
  Filled 2024-04-24 (×5): qty 1

## 2024-04-24 MED ORDER — LISINOPRIL 20 MG PO TABS
ORAL_TABLET | ORAL | Status: AC
Start: 2024-04-24 — End: 2024-04-24
  Filled 2024-04-24: qty 1

## 2024-04-24 MED ORDER — MIDAZOLAM HCL 5 MG/5ML IJ SOLN
INTRAMUSCULAR | Status: DC | PRN
  Administered 2024-04-24: 2 mg via INTRAVENOUS

## 2024-04-24 MED ORDER — PHENYLEPHRINE HCL-NACL 20-0.9 MG/250ML-% IV SOLN
INTRAVENOUS | Status: DC | PRN
  Administered 2024-04-24: 25 ug/min via INTRAVENOUS

## 2024-04-24 MED ORDER — ACETAMINOPHEN 325 MG PO TABS: 325.0000 mg | ORAL_TABLET | Freq: Four times a day (QID) | ORAL | Status: DC | PRN

## 2024-04-24 MED ORDER — FENTANYL CITRATE (PF) 100 MCG/2ML IJ SOLN: 25.0000 ug | INTRAMUSCULAR | Status: DC | PRN

## 2024-04-24 MED ORDER — TRAMADOL HCL 50 MG PO TABS
50.0000 mg | ORAL_TABLET | ORAL | Status: DC | PRN
  Administered 2024-04-24: 100 mg via ORAL
  Administered 2024-04-25: 50 mg via ORAL
  Filled 2024-04-24: qty 2
  Filled 2024-04-24: qty 1

## 2024-04-24 MED ORDER — ACETAMINOPHEN 10 MG/ML IV SOLN
INTRAVENOUS | Status: AC
Start: 2024-04-24 — End: 2024-04-24
  Filled 2024-04-24: qty 100

## 2024-04-24 MED ORDER — PROPOFOL 10 MG/ML IV BOLUS
INTRAVENOUS | Status: DC | PRN
  Administered 2024-04-24: 50 mg via INTRAVENOUS
  Administered 2024-04-24: 80 mg via INTRAVENOUS

## 2024-04-24 MED ORDER — SUCCINYLCHOLINE CHLORIDE 200 MG/10ML IV SOSY
PREFILLED_SYRINGE | INTRAVENOUS | Status: DC | PRN
  Administered 2024-04-24: 100 mg via INTRAVENOUS

## 2024-04-24 MED ORDER — SUGAMMADEX SODIUM 200 MG/2ML IV SOLN
INTRAVENOUS | Status: DC | PRN
  Administered 2024-04-24: 200 mg via INTRAVENOUS

## 2024-04-24 MED ORDER — FERROUS SULFATE 325 (65 FE) MG PO TABS
325.0000 mg | ORAL_TABLET | Freq: Two times a day (BID) | ORAL | Status: DC
  Administered 2024-04-25 – 2024-04-26 (×2): 325 mg via ORAL
  Filled 2024-04-24 (×2): qty 1

## 2024-04-24 MED ORDER — CHLORHEXIDINE GLUCONATE 0.12 % MT SOLN
OROMUCOSAL | Status: AC
  Filled 2024-04-24: qty 15

## 2024-04-24 MED ORDER — HYDROMORPHONE HCL 1 MG/ML IJ SOLN: 0.5000 mg | INTRAMUSCULAR | Status: DC | PRN

## 2024-04-24 MED ORDER — OXYCODONE HCL 5 MG PO TABS
ORAL_TABLET | ORAL | Status: AC
  Filled 2024-04-24: qty 1

## 2024-04-24 MED ORDER — ORAL CARE MOUTH RINSE: 15.0000 mL | Freq: Once | OROMUCOSAL | Status: AC

## 2024-04-24 MED ORDER — CEFAZOLIN SODIUM-DEXTROSE 2-4 GM/100ML-% IV SOLN
2.0000 g | Freq: Four times a day (QID) | INTRAVENOUS | Status: AC
  Administered 2024-04-24 (×2): 2 g via INTRAVENOUS
  Filled 2024-04-24 (×2): qty 100

## 2024-04-24 MED ORDER — TRANEXAMIC ACID-NACL 1000-0.7 MG/100ML-% IV SOLN
1000.0000 mg | Freq: Once | INTRAVENOUS | Status: AC
  Administered 2024-04-24: 1000 mg via INTRAVENOUS

## 2024-04-24 MED ORDER — OXYCODONE HCL 5 MG/5ML PO SOLN: 5.0000 mg | Freq: Once | ORAL | Status: AC | PRN

## 2024-04-24 MED ORDER — ONDANSETRON HCL 4 MG PO TABS: 4.0000 mg | ORAL_TABLET | Freq: Four times a day (QID) | ORAL | Status: DC | PRN

## 2024-04-24 MED ORDER — ACETAMINOPHEN 10 MG/ML IV SOLN
1000.0000 mg | Freq: Four times a day (QID) | INTRAVENOUS | Status: AC
  Administered 2024-04-24 – 2024-04-25 (×4): 1000 mg via INTRAVENOUS
  Filled 2024-04-24 (×4): qty 100

## 2024-04-24 MED ORDER — GABAPENTIN 300 MG PO CAPS
ORAL_CAPSULE | ORAL | Status: AC
  Filled 2024-04-24: qty 1

## 2024-04-24 MED ORDER — SENNOSIDES-DOCUSATE SODIUM 8.6-50 MG PO TABS
1.0000 | ORAL_TABLET | Freq: Two times a day (BID) | ORAL | Status: DC
  Administered 2024-04-24 – 2024-04-26 (×5): 1 via ORAL
  Filled 2024-04-24 (×5): qty 1

## 2024-04-24 MED ORDER — FENTANYL CITRATE (PF) 100 MCG/2ML IJ SOLN
INTRAMUSCULAR | Status: DC | PRN
  Administered 2024-04-24: 50 ug via INTRAVENOUS

## 2024-04-24 MED ORDER — DEXAMETHASONE SODIUM PHOSPHATE 10 MG/ML IJ SOLN
INTRAMUSCULAR | Status: AC
Start: 2024-04-24 — End: 2024-04-24
  Filled 2024-04-24: qty 1

## 2024-04-24 MED ORDER — BISACODYL 10 MG RE SUPP: 10.0000 mg | Freq: Every day | RECTAL | Status: DC | PRN

## 2024-04-24 MED ORDER — PANTOPRAZOLE SODIUM 40 MG PO TBEC
40.0000 mg | DELAYED_RELEASE_TABLET | Freq: Two times a day (BID) | ORAL | Status: DC
  Administered 2024-04-24 – 2024-04-26 (×5): 40 mg via ORAL
  Filled 2024-04-24 (×5): qty 1

## 2024-04-24 MED ORDER — CHLORHEXIDINE GLUCONATE 0.12 % MT SOLN
15.0000 mL | Freq: Once | OROMUCOSAL | Status: AC
  Administered 2024-04-24: 15 mL via OROMUCOSAL

## 2024-04-24 MED ORDER — LISINOPRIL-HYDROCHLOROTHIAZIDE 20-25 MG PO TABS: 1.0000 | ORAL_TABLET | Freq: Every day | ORAL | Status: DC

## 2024-04-24 MED ORDER — ACETAMINOPHEN 10 MG/ML IV SOLN
1000.0000 mg | Freq: Once | INTRAVENOUS | Status: DC | PRN
Start: 2024-04-24 — End: 2024-04-24

## 2024-04-24 MED ORDER — ACETAMINOPHEN 10 MG/ML IV SOLN
INTRAVENOUS | Status: DC | PRN
  Administered 2024-04-24: 1000 mg via INTRAVENOUS

## 2024-04-24 MED ORDER — EPHEDRINE SULFATE-NACL 50-0.9 MG/10ML-% IV SOSY
PREFILLED_SYRINGE | INTRAVENOUS | Status: DC | PRN
  Administered 2024-04-24: 5 mg via INTRAVENOUS

## 2024-04-24 MED ORDER — OXYCODONE HCL 5 MG PO TABS
5.0000 mg | ORAL_TABLET | ORAL | Status: DC | PRN
  Administered 2024-04-24: 5 mg via ORAL
  Filled 2024-04-24: qty 1

## 2024-04-24 MED ORDER — INSULIN ASPART 100 UNIT/ML IJ SOLN: 0.0000 [IU] | Freq: Every day | INTRAMUSCULAR | Status: DC

## 2024-04-24 MED ORDER — DEXAMETHASONE SODIUM PHOSPHATE 10 MG/ML IJ SOLN
8.0000 mg | Freq: Once | INTRAMUSCULAR | Status: AC
  Administered 2024-04-24: 8 mg via INTRAVENOUS

## 2024-04-24 MED ORDER — PHENOL 1.4 % MT LIQD
1.0000 | OROMUCOSAL | Status: DC | PRN
  Filled 2024-04-24: qty 177

## 2024-04-24 MED ORDER — MAGNESIUM HYDROXIDE 400 MG/5ML PO SUSP
30.0000 mL | Freq: Every day | ORAL | Status: DC
  Administered 2024-04-26: 30 mL via ORAL
  Filled 2024-04-24 (×2): qty 30

## 2024-04-24 MED ORDER — PROPOFOL 1000 MG/100ML IV EMUL
INTRAVENOUS | Status: AC
Start: 2024-04-24 — End: 2024-04-24
  Filled 2024-04-24: qty 100

## 2024-04-24 MED ORDER — ALUM & MAG HYDROXIDE-SIMETH 200-200-20 MG/5ML PO SUSP: 30.0000 mL | ORAL | Status: DC | PRN

## 2024-04-24 MED ORDER — DROPERIDOL 2.5 MG/ML IJ SOLN: 0.6250 mg | Freq: Once | INTRAMUSCULAR | Status: DC | PRN

## 2024-04-24 MED ORDER — GABAPENTIN 100 MG PO CAPS
300.0000 mg | ORAL_CAPSULE | Freq: Every day | ORAL | Status: DC
  Administered 2024-04-24 – 2024-04-25 (×2): 300 mg via ORAL
  Filled 2024-04-24 (×2): qty 3

## 2024-04-24 MED ORDER — ROCURONIUM BROMIDE 100 MG/10ML IV SOLN
INTRAVENOUS | Status: DC | PRN
  Administered 2024-04-24: 60 mg via INTRAVENOUS
  Administered 2024-04-24 (×5): 20 mg via INTRAVENOUS

## 2024-04-24 MED ORDER — SURGIPHOR WOUND IRRIGATION SYSTEM - OPTIME
TOPICAL | Status: DC | PRN
Start: 2024-04-24 — End: 2024-04-24

## 2024-04-24 MED ORDER — TRANEXAMIC ACID-NACL 1000-0.7 MG/100ML-% IV SOLN
1000.0000 mg | INTRAVENOUS | Status: AC
  Administered 2024-04-24: 1000 mg via INTRAVENOUS

## 2024-04-24 MED ORDER — GLYCOPYRROLATE 0.2 MG/ML IJ SOLN
INTRAMUSCULAR | Status: DC | PRN
  Administered 2024-04-24: .2 mg via INTRAVENOUS

## 2024-04-24 MED ORDER — PHENYLEPHRINE HCL-NACL 20-0.9 MG/250ML-% IV SOLN
INTRAVENOUS | Status: AC
  Filled 2024-04-24: qty 250

## 2024-04-24 MED ORDER — METOCLOPRAMIDE HCL 10 MG PO TABS
10.0000 mg | ORAL_TABLET | Freq: Three times a day (TID) | ORAL | Status: DC
  Administered 2024-04-24 – 2024-04-26 (×4): 10 mg via ORAL
  Filled 2024-04-24 (×5): qty 1

## 2024-04-24 MED ORDER — CHLORHEXIDINE GLUCONATE 4 % EX SOLN
60.0000 mL | Freq: Once | CUTANEOUS | Status: DC
  Administered 2024-04-24: 4 via TOPICAL

## 2024-04-24 MED ORDER — TRANEXAMIC ACID-NACL 1000-0.7 MG/100ML-% IV SOLN
INTRAVENOUS | Status: AC
  Filled 2024-04-24: qty 100

## 2024-04-24 MED ORDER — ONDANSETRON HCL 4 MG/2ML IJ SOLN
INTRAMUSCULAR | Status: AC
  Filled 2024-04-24: qty 2

## 2024-04-24 MED ORDER — PROPOFOL 10 MG/ML IV BOLUS
INTRAVENOUS | Status: AC
  Filled 2024-04-24: qty 40

## 2024-04-24 MED ORDER — MENTHOL 3 MG MT LOZG
1.0000 | LOZENGE | OROMUCOSAL | Status: DC | PRN
  Filled 2024-04-24: qty 9

## 2024-04-24 MED ORDER — CELECOXIB 200 MG PO CAPS
200.0000 mg | ORAL_CAPSULE | Freq: Two times a day (BID) | ORAL | Status: DC
  Administered 2024-04-24 – 2024-04-26 (×4): 200 mg via ORAL
  Filled 2024-04-24 (×4): qty 1

## 2024-04-24 MED ORDER — FLEET ENEMA RE ENEM: 1.0000 | ENEMA | Freq: Once | RECTAL | Status: DC | PRN

## 2024-04-24 MED ORDER — SODIUM CHLORIDE 0.9 % IV SOLN: INTRAVENOUS | Status: DC

## 2024-04-24 MED ORDER — ROCURONIUM BROMIDE 10 MG/ML (PF) SYRINGE
PREFILLED_SYRINGE | INTRAVENOUS | Status: AC
  Filled 2024-04-24: qty 10

## 2024-04-24 MED ORDER — CELECOXIB 200 MG PO CAPS
400.0000 mg | ORAL_CAPSULE | Freq: Once | ORAL | Status: AC
  Administered 2024-04-24: 400 mg via ORAL

## 2024-04-24 MED ORDER — BUPIVACAINE HCL (PF) 0.5 % IJ SOLN
INTRAMUSCULAR | Status: AC
  Filled 2024-04-24: qty 10

## 2024-04-24 MED ORDER — ONDANSETRON HCL 4 MG/2ML IJ SOLN: 4.0000 mg | Freq: Four times a day (QID) | INTRAMUSCULAR | Status: DC | PRN

## 2024-04-24 MED ORDER — BUPIVACAINE HCL (PF) 0.5 % IJ SOLN
INTRAMUSCULAR | Status: DC | PRN
  Administered 2024-04-24: 3 mL via INTRATHECAL

## 2024-04-24 MED ORDER — CEFAZOLIN SODIUM-DEXTROSE 2-4 GM/100ML-% IV SOLN
INTRAVENOUS | Status: AC
  Filled 2024-04-24: qty 100

## 2024-04-24 MED ORDER — CELECOXIB 200 MG PO CAPS
ORAL_CAPSULE | ORAL | Status: AC
Start: 2024-04-24 — End: 2024-04-24
  Filled 2024-04-24: qty 2

## 2024-04-24 MED ORDER — INSULIN ASPART 100 UNIT/ML IJ SOLN
0.0000 [IU] | Freq: Three times a day (TID) | INTRAMUSCULAR | Status: DC
  Administered 2024-04-24: 3 [IU] via SUBCUTANEOUS
  Filled 2024-04-24: qty 1

## 2024-04-24 MED ORDER — FENTANYL CITRATE (PF) 100 MCG/2ML IJ SOLN
INTRAMUSCULAR | Status: AC
  Filled 2024-04-24: qty 2

## 2024-04-24 MED ORDER — 0.9 % SODIUM CHLORIDE (POUR BTL) OPTIME
TOPICAL | Status: DC | PRN
  Administered 2024-04-24: 500 mL

## 2024-04-24 MED ORDER — OXYCODONE HCL 5 MG PO TABS: 10.0000 mg | ORAL_TABLET | ORAL | Status: DC | PRN

## 2024-04-24 MED ORDER — PROPOFOL 500 MG/50ML IV EMUL
INTRAVENOUS | Status: DC | PRN
  Administered 2024-04-24: 100 ug/kg/min via INTRAVENOUS

## 2024-04-24 MED ORDER — MIDAZOLAM HCL 2 MG/2ML IJ SOLN
INTRAMUSCULAR | Status: AC
  Filled 2024-04-24: qty 2

## 2024-04-24 MED ORDER — ONDANSETRON HCL 4 MG/2ML IJ SOLN
INTRAMUSCULAR | Status: DC | PRN
Start: 2024-04-24 — End: 2024-04-24
  Administered 2024-04-24: 4 mg via INTRAVENOUS

## 2024-04-24 MED ORDER — CEFAZOLIN SODIUM-DEXTROSE 2-4 GM/100ML-% IV SOLN
2.0000 g | INTRAVENOUS | Status: AC
  Administered 2024-04-24: 2 g via INTRAVENOUS

## 2024-04-24 MED ORDER — GABAPENTIN 300 MG PO CAPS
300.0000 mg | ORAL_CAPSULE | Freq: Once | ORAL | Status: AC
  Administered 2024-04-24: 300 mg via ORAL

## 2024-04-24 MED ORDER — GLYCOPYRROLATE 0.2 MG/ML IJ SOLN
INTRAMUSCULAR | Status: AC
  Filled 2024-04-24: qty 1

## 2024-04-24 MED ORDER — DIPHENHYDRAMINE HCL 12.5 MG/5ML PO ELIX: 12.5000 mg | ORAL_SOLUTION | ORAL | Status: DC | PRN

## 2024-04-24 SURGICAL SUPPLY — 48 items
BLADE CLIPPER SURG (BLADE) IMPLANT
BLADE SAW 90X25X1.19 OSCILLAT (BLADE) ×1 IMPLANT
BRUSH SCRUB EZ PLAIN DRY (MISCELLANEOUS) ×1 IMPLANT
CUP ACETBLR 52 OD 100 SERIES (Hips) IMPLANT
DRAPE INCISE IOBAN 66X45 STRL (DRAPES) IMPLANT
DRAPE INCISE IOBAN 66X60 STRL (DRAPES) ×1 IMPLANT
DRAPE SHEET LG 3/4 BI-LAMINATE (DRAPES) ×1 IMPLANT
DRSG AQUACEL AG ADV 3.5X14 (GAUZE/BANDAGES/DRESSINGS) ×1 IMPLANT
DRSG MEPILEX SACRM 8.7X9.8 (GAUZE/BANDAGES/DRESSINGS) ×1 IMPLANT
DRSG TEGADERM 4X4.75 (GAUZE/BANDAGES/DRESSINGS) ×1 IMPLANT
DURAPREP 26ML APPLICATOR (WOUND CARE) ×2 IMPLANT
ELECT CAUTERY BLADE 6.4 (BLADE) ×1 IMPLANT
ELECTRODE REM PT RTRN 9FT ADLT (ELECTROSURGICAL) ×1 IMPLANT
EVACUATOR 1/8 PVC DRAIN (DRAIN) ×1 IMPLANT
GAUZE XEROFORM 1X8 LF (GAUZE/BANDAGES/DRESSINGS) ×1 IMPLANT
GLOVE BIOGEL M STRL SZ7.5 (GLOVE) ×6 IMPLANT
GLOVE BIOGEL PI IND STRL 8 (GLOVE) ×1 IMPLANT
GLOVE SRG 8 PF TXTR STRL LF DI (GLOVE) ×1 IMPLANT
GOWN STRL REUS W/ TWL LRG LVL3 (GOWN DISPOSABLE) ×2 IMPLANT
GOWN STRL REUS W/ TWL XL LVL3 (GOWN DISPOSABLE) ×1 IMPLANT
GOWN TOGA ZIPPER T7+ PEEL AWAY (MISCELLANEOUS) ×1 IMPLANT
HANDLE YANKAUER SUCT OPEN TIP (MISCELLANEOUS) ×1 IMPLANT
HEAD M SROM 36MM PLUS 1.5 (Hips) IMPLANT
HOLDER FOLEY CATH W/STRAP (MISCELLANEOUS) ×1 IMPLANT
HOOD PEEL AWAY T7 (MISCELLANEOUS) ×1 IMPLANT
KIT PEG BOARD PINK (KITS) ×1 IMPLANT
KIT TURNOVER KIT A (KITS) ×1 IMPLANT
LINER NEUTRAL 52X36MM PLUS 4 (Liner) IMPLANT
MANIFOLD NEPTUNE II (INSTRUMENTS) ×2 IMPLANT
NS IRRIG 500ML POUR BTL (IV SOLUTION) ×1 IMPLANT
PACK HIP PROSTHESIS (MISCELLANEOUS) ×1 IMPLANT
PENCIL SMOKE EVACUATOR COATED (MISCELLANEOUS) ×1 IMPLANT
PIN STEIN THRED 5/32 (Pin) ×1 IMPLANT
SOL .9 NS 3000ML IRR UROMATIC (IV SOLUTION) ×1 IMPLANT
SOLUTION IRRIG SURGIPHOR (IV SOLUTION) ×1 IMPLANT
SPONGE DRAIN TRACH 4X4 STRL 2S (GAUZE/BANDAGES/DRESSINGS) ×1 IMPLANT
STAPLER SKIN PROX 35W (STAPLE) ×1 IMPLANT
STEM FEM SZ3 STD ACTIS (Stem) IMPLANT
SUT ETHIBOND #5 BRAIDED 30INL (SUTURE) ×1 IMPLANT
SUT VIC AB 0 CT1 36 (SUTURE) ×2 IMPLANT
SUT VIC AB 1 CT1 36 (SUTURE) ×2 IMPLANT
SUT VIC AB 2-0 CT1 TAPERPNT 27 (SUTURE) ×1 IMPLANT
TAPE CLOTH 3X10 WHT NS LF (GAUZE/BANDAGES/DRESSINGS) ×1 IMPLANT
TIP FAN IRRIG PULSAVAC PLUS (DISPOSABLE) ×1 IMPLANT
TOWEL OR 17X26 4PK STRL BLUE (TOWEL DISPOSABLE) IMPLANT
TRAP FLUID SMOKE EVACUATOR (MISCELLANEOUS) ×1 IMPLANT
TRAY FOLEY MTR SLVR 16FR STAT (SET/KITS/TRAYS/PACK) ×1 IMPLANT
WATER STERILE IRR 1000ML POUR (IV SOLUTION) ×1 IMPLANT

## 2024-04-24 NOTE — TOC Progression Note (Signed)
 Transition of Care [facility]) - Progression Note    Patient Details  Name: Janet Walters MRN: [id number] Date of Birth: May 25, 1961  [facility] CM/SW Contact  Racheal LITTIE Schimke, RN Phone Number: 04/24/2024, 4:17 PM  Clinical Narrative:  3in1 ordered with [employer].          Expected Discharge Plan and Services                                               Social Determinants of Health (SDOH) Interventions SDOH Screenings   Food Insecurity: No Food Insecurity (04/24/2024)  Housing: Unknown (04/24/2024)  Transportation Needs: No Transportation Needs (04/24/2024)  Utilities: Not At Risk (04/24/2024)  Alcohol Screen: Low Risk  (04/27/2022)  Depression (PHQ2-9): Low Risk  (11/25/2023)  Financial Resource Strain: Low Risk  (05/13/2023)  Social Connections: Unknown (05/13/2023)  Stress: Stress Concern Present (05/13/2023)  Tobacco Use: Low Risk  (04/24/2024)    Readmission Risk Interventions     No data to display

## 2024-04-24 NOTE — Anesthesia Procedure Notes (Signed)
 Procedure Name: Intubation Date/Time: 04/24/2024 7:36 AM  Performed by: Duwayne Craven, CRNAPre-anesthesia Checklist: Patient identified, Patient being monitored, Timeout performed, Emergency Drugs available and Suction available Patient Re-evaluated:Patient Re-evaluated prior to induction Oxygen Delivery Method: Circle system utilized Preoxygenation: Pre-oxygenation with 100% oxygen Induction Type: IV induction Ventilation: Mask ventilation without difficulty Laryngoscope Size: 3 and McGrath Grade View: Grade I Tube type: Oral Tube size: 7.0 mm Number of attempts: 1 Airway Equipment and Method: Stylet Placement Confirmation: ETT inserted through vocal cords under direct vision, positive ETCO2 and breath sounds checked- equal and bilateral Secured at: 22 cm Tube secured with: Tape Dental Injury: Teeth and Oropharynx as per pre-operative assessment

## 2024-04-24 NOTE — Interval H&P Note (Signed)
 History and Physical Interval Note:  04/24/2024 6:31 AM  Janet Walters  has presented today for surgery, with the diagnosis of Primary osteoarthritis of left hip.  The various methods of treatment have been discussed with the patient and family. After consideration of risks, benefits and other options for treatment, the patient has consented to  Procedure(s): ARTHROPLASTY, HIP, TOTAL,POSTERIOR APPROACH (Left) as a surgical intervention.  The patient's history has been reviewed, patient examined, no change in status, stable for surgery.  I have reviewed the patient's chart and labs.  Questions were answered to the patient's satisfaction.     Marquette Blodgett P Diamon Reddinger

## 2024-04-24 NOTE — Telephone Encounter (Unsigned)
 Copied from CRM [id number]. Topic: General - Other >> Apr 21, 2024  4:21 PM Avram MATSU wrote: Reason for CRM: [place] from [facility] and stated patient potassium is low and needs to be evaluated before patient has surgery on Monday.

## 2024-04-24 NOTE — Transfer of Care (Signed)
 Immediate Anesthesia Transfer of Care Note  Patient: Janet Walters  Procedure(s) Performed: ARTHROPLASTY, HIP, TOTAL,POSTERIOR APPROACH (Left: Hip)  Patient Location: PACU  Anesthesia Type:General, Spinal  Level of Consciousness: awake and alert   Airway & Oxygen Therapy: Patient Spontanous Breathing and Patient connected to face mask oxygen  Post-op Assessment: Report given to RN and Post -op Vital signs reviewed and stable  Post vital signs: Reviewed and stable  Last Vitals:  Vitals Value Taken Time  BP 117/72   Temp    Pulse 80   Resp 12   SpO2 100     Last Pain:  Vitals:   04/24/24 0656  TempSrc: Temporal  PainSc: 6          Complications: No notable events documented.

## 2024-04-24 NOTE — Anesthesia Procedure Notes (Signed)
 Spinal  Patient location during procedure: OR Start time: 04/24/2024 7:22 AM End time: 04/24/2024 7:28 AM Reason for block: surgical anesthesia Staffing Performed: resident/CRNA  Resident/CRNA: Duwayne Craven, CRNA Performed by: Duwayne Craven, CRNA Authorized by: Myra Lynwood MATSU, MD   Preanesthetic Checklist Completed: patient identified, IV checked, site marked, risks and benefits discussed, surgical consent, monitors and equipment checked, pre-op evaluation and timeout performed Spinal Block Patient position: sitting Prep: ChloraPrep Patient monitoring: heart rate, continuous pulse ox, blood pressure and cardiac monitor Approach: midline Location: L3-4 Injection technique: single-shot Needle Needle type: Whitacre and Introducer  Needle gauge: 24 G Needle length: 9 cm Assessment Sensory level: T12 Events: CSF return Additional Notes Sterile aseptic technique used throughout the procedure.  Negative paresthesia. Negative blood return. Positive free-flowing CSF. Expiration date of kit checked and confirmed. Patient tolerated procedure well, without complications.

## 2024-04-24 NOTE — Progress Notes (Signed)
 Patient is not able to walk the distance required to go the bathroom, or he/she is unable to safely negotiate stairs required to access the bathroom.  A 3in1 BSC will alleviate this problem   Amenda Duclos P. Angie Fava M.D.

## 2024-04-24 NOTE — Anesthesia Preprocedure Evaluation (Signed)
 Anesthesia Evaluation  Patient identified by MRN, date of birth, ID band Patient awake    Reviewed: Allergy & Precautions, H&P , NPO status , Patient's Chart, lab work & pertinent test results, reviewed documented beta blocker date and time   Airway Mallampati: II   Neck ROM: full    Dental  (+) Poor Dentition   Pulmonary neg pulmonary ROS   Pulmonary exam normal        Cardiovascular Exercise Tolerance: Poor hypertension, On Medications negative cardio ROS Normal cardiovascular exam Rhythm:regular Rate:Normal     Neuro/Psych negative neurological ROS  negative psych ROS   GI/Hepatic Neg liver ROS,GERD  Medicated,,  Endo/Other  negative endocrine ROSdiabetes, Well Controlled    Renal/GU negative Renal ROS  negative genitourinary   Musculoskeletal   Abdominal   Peds  Hematology negative hematology ROS (+)   Anesthesia Other Findings Past Medical History: No date: Back pain, chronic No date: DDD (degenerative disc disease), lumbar No date: Diabetes mellitus without complication (HCC) No date: Genital herpes No date: GERD (gastroesophageal reflux disease) No date: Hypertension No date: Insomnia Past Surgical History: No date: COLONOSCOPY 09/18/2016: COLONOSCOPY WITH PROPOFOL ; N/A     Comment:  Procedure: COLONOSCOPY WITH PROPOFOL ;  Surgeon: Lamar ONEIDA Holmes, MD;  Location: [facility] ENDOSCOPY;  Service:               Endoscopy;  Laterality: N/A; 07/21/2022: TOTAL HIP ARTHROPLASTY; Right     Comment:  Procedure: TOTAL HIP ARTHROPLASTY ANTERIOR APPROACH;                Surgeon: Leora Lynwood SAUNDERS, MD;  Location: [facility] ORS;                Service: Orthopedics;  Laterality: Right; BMI    Body Mass Index: 37.61 kg/m     Reproductive/Obstetrics negative OB ROS                              Anesthesia Physical Anesthesia Plan  ASA: 3  Anesthesia Plan: Spinal   Post-op Pain  Management:    Induction:   PONV Risk Score and Plan:   Airway Management Planned:   Additional Equipment:   Intra-op Plan:   Post-operative Plan:   Informed Consent: I have reviewed the patients History and Physical, chart, labs and discussed the procedure including the risks, benefits and alternatives for the proposed anesthesia with the patient or authorized representative who has indicated his/her understanding and acceptance.     Dental Advisory Given  Plan Discussed with: CRNA  Anesthesia Plan Comments:         Anesthesia Quick Evaluation

## 2024-04-24 NOTE — Op Note (Signed)
 OPERATIVE NOTE  DATE OF SURGERY:  04/24/2024  PATIENT NAME:  Janet Walters   DOB: [birth year]  MRN: [id number]  PRE-OPERATIVE DIAGNOSIS: Degenerative arthrosis of the left hip, primary  POST-OPERATIVE DIAGNOSIS:  Same  PROCEDURE:  Left total hip arthroplasty  SURGEON:  Lynwood SHAUNNA Mardee Mickey. M.D.  ASSISTANT:  Sidra Koyanagi, PA-C (present and scrubbed throughout the case, critical for assistance with exposure, retraction, instrumentation, and closure)  ANESTHESIA: spinal and general  ESTIMATED BLOOD LOSS: 150 mL  FLUIDS REPLACED: 1200 mL of crystalloid  DRAINS: 2 medium Hemovac drains  IMPLANTS UTILIZED: DePuy size 3 standard offset Actis femoral stem, 52 mm OD Pinnacle 100 acetabular component, +4 mm neutral Pinnacle Altrx polyethylene insert, and a 36 mm M-SPEC +1.5 mm hip ball  INDICATIONS FOR SURGERY: Janet Walters is a 63 y.o. year old female with a long history of progressive hip and groin  pain. X-rays demonstrated severe degenerative changes. The patient had not seen any significant improvement despite conservative nonsurgical intervention. After discussion of the risks and benefits of surgical intervention, the patient expressed understanding of the risks benefits and agree with plans for total hip arthroplasty.   The risks, benefits, and alternatives were discussed at length including but not limited to the risks of infection, bleeding, nerve injury, stiffness, blood clots, the need for revision surgery, limb length inequality, dislocation, cardiopulmonary complications, among others, and they were willing to proceed.  PROCEDURE IN DETAIL: The patient was brought into the operating room and, after adequate spinal and general anesthesia was achieved, the patient was placed in a right lateral decubitus position. Axillary roll was placed and all bony prominences were well-padded. The patient's left hip was cleaned and prepped with alcohol and DuraPrep and draped in the usual sterile  fashion. A timeout was performed as per usual protocol. A lateral curvilinear incision was made gently curving towards the posterior superior iliac spine. The IT band was incised in line with the skin incision and the fibers of the gluteus maximus were split in line. The piriformis tendon was identified, skeletonized, and incised at its insertion to the proximal femur and reflected posteriorly. A T type posterior capsulotomy was performed.  Due to the relative protrusio, care was taken to remove some of the superior osteophytes.  The femoral head was then carefully dislocated posteriorly. Inspection of the femoral head demonstrated severe degenerative changes with full-thickness loss of articular cartilage. The femoral neck cut was performed using an oscillating saw. The anterior capsule was elevated off of the femoral neck using a periosteal elevator. Attention was then directed to the acetabulum. The remnant of the labrum was excised using electrocautery. Inspection of the acetabulum also demonstrated significant degenerative changes. The acetabulum was reamed in sequential fashion up to a 51 mm diameter. Good punctate bleeding bone was encountered. A 52 mm Pinnacle 100 acetabular component was positioned and impacted into place. Good scratch fit was appreciated. A +4 mm neutral polyethylene trial was inserted.  Attention was then directed to the proximal femur.  Femoral broaches were inserted in a sequential fashion up to a size 3 broach. Calcar region was planed and a trial reduction was performed using a standard offset neck and a 36 mm hip ball with a +1.5 mm neck length. Good equalization of limb lengths and hip offset was appreciated and excellent stability was noted both anteriorly and posteriorly. Trial components were removed. The acetabular shell was irrigated with copious amounts of normal saline with antibiotic solution and suctioned dry. A +  4 mm neutral Pinnacle Altrx polyethylene insert was  positioned and impacted into place. Next, a size 3 standard offset Actis femoral stem was positioned and impacted into place. Excellent scratch fit was appreciated. A trial reduction was again performed with a 36 mm hip ball with a +1.5 mm neck length. Again, good equalization of limb lengths was appreciated and excellent stability appreciated both anteriorly and posteriorly. The hip was then dislocated and the trial hip ball was removed. The Morse taper was cleaned and dried. A 36 mm M-SPEC hip ball with a +1.5 mm neck length was placed on the trunnion and impacted into place. The hip was then reduced and placed through range of motion. Excellent stability was appreciated both anteriorly and posteriorly.  The wound was irrigated with copious amounts of normal saline followed by 450 ml of Surgiphor and suctioned dry. Good hemostasis was appreciated. The posterior capsulotomy was repaired using #5 Ethibond. Piriformis tendon was reapproximated to the undersurface of the gluteus medius tendon using #5 Ethibond. The IT band was reapproximated using interrupted sutures of #1 Vicryl. Subcutaneous tissue was approximated using first #0 Vicryl followed by #2-0 Vicryl. The skin was closed with skin staples.  The patient tolerated the procedure well and was transported to the recovery room in stable condition.   Lynwood SHAUNNA Mardee Mickey., M.D.

## 2024-04-24 NOTE — Plan of Care (Signed)
  Problem: Skin Integrity: Goal: Risk for impaired skin integrity will decrease Outcome: Progressing   Problem: Tissue Perfusion: Goal: Adequacy of tissue perfusion will improve Outcome: Progressing   Problem: Activity: Goal: Ability to avoid complications of mobility impairment will improve Outcome: Progressing   Problem: Clinical Measurements: Goal: Postoperative complications will be avoided or minimized Outcome: Progressing

## 2024-04-24 NOTE — Evaluation (Signed)
 Physical Therapy Evaluation Patient Details Name: Janet Walters MRN: [id number] DOB: [birth year] Today's Date: 04/24/2024  History of Present Illness  Pt is a 63 y.o. female s/p L THA secondary to degenerative arthrosis L hip 04/24/24.  PMH includes htn, DM, chronic back pain, insomnia, R THA (anterior approach).  Clinical Impression  Prior to surgery, pt reports being independent with ambulation (limited d/t L hip pain/impairments); lives alone in 1 level home with 5 STE B railings.  8/10 L hip and L shin pain reported at rest beginning of session (pt reports h/o L shin pain prior to surgery); end of session L hip pain 6/10 and L shin pain 7/10--nurse notified/updated regarding pt's pain.  Currently pt is min assist semi-supine to sitting EOB; min assist progressing to CGA with transfers; and CGA to ambulate short distance to/from bathroom with RW for toileting.  Pt performed LE THR ex's in bed with assist as needed.  Educated pt on posterior THP's.  Pt would currently benefit from skilled PT to address noted impairments and functional limitations (see below for any additional details).  Upon hospital discharge, pt would benefit from ongoing therapy.     If plan is discharge home, recommend the following: A little help with walking and/or transfers;A little help with bathing/dressing/bathroom;Assistance with cooking/housework;Assist for transportation;Help with stairs or ramp for entrance   Can travel by private vehicle    Yes    Equipment Recommendations None recommended by PT (pt has RW and toilet riser at home already); pt declined [facility]  Recommendations for Other Services       Functional Status Assessment Patient has had a recent decline in their functional status and demonstrates the ability to make significant improvements in function in a reasonable and predictable amount of time.     Precautions / Restrictions Precautions Precautions: Posterior Hip;Fall Precaution Booklet Issued: Yes  (comment) Recall of Precautions/Restrictions: Intact Restrictions Weight Bearing Restrictions Per Provider Order: Yes LLE Weight Bearing Per Provider Order: Weight bearing as tolerated      Mobility  Bed Mobility Overal bed mobility: Needs Assistance Bed Mobility: Supine to Sit     Supine to sit: Min assist, HOB elevated     General bed mobility comments: assist for L LE and lines; vc's for posterior THP's    Transfers Overall transfer level: Needs assistance Equipment used: Rolling walker (2 wheels) Transfers: Sit to/from Stand Sit to Stand: Min assist, Contact guard assist           General transfer comment: min assist to stand from bed and CGA to stand from [facility] over toilet; vc's for UE/LE positioning, posterior THP's, and overall technique    Ambulation/Gait Ambulation/Gait assistance: Contact guard assist Gait Distance (Feet):  (10 feet x2 (to/from bathroom)) Assistive device: Rolling walker (2 wheels)   Gait velocity: decreased     General Gait Details: antalgic; decreased stance time L LE; vc's for posterior THP's with turns and also overall gait technique/LE sequencing  Stairs            Wheelchair Mobility     Tilt Bed    Modified Rankin (Stroke Patients Only)       Balance Overall balance assessment: Needs assistance Sitting-balance support: No upper extremity supported, Feet supported Sitting balance-Leahy Scale: Good Sitting balance - Comments: steady reaching within BOS   Standing balance support: Bilateral upper extremity supported, During functional activity, Reliant on assistive device for balance Standing balance-Leahy Scale: Good Standing balance comment: steady ambulating with RW use  Pertinent Vitals/Pain Pain Assessment Pain Assessment: 0-10 Pain Score: 7  (7/10 L shin; 6/10 L hip) Pain Location: L hip and L shin Pain Descriptors / Indicators: Aching, Sore, Tender Pain  Intervention(s): Limited activity within patient's tolerance, Monitored during session, Premedicated before session, Repositioned, Ice applied, Other (comment) (Nurse updated regarding pt's pain) Vitals stable (HR in 70's bpm and SpO2 sats 94% or greater on room air) during session.    Home Living Family/patient expects to be discharged to:: Private residence Living Arrangements: Alone   Type of Home: House Home Access: Stairs to enter Entrance Stairs-Rails: Right;Left;Can reach both Entrance Stairs-Number of Steps: 5   Home Layout: One level Home Equipment: Educational psychologist (2 wheels);BSC/3in1;Cane - quad;Cane - single point      Prior Function Prior Level of Function : Independent/Modified Independent             Mobility Comments: Pt ambulatory with cane (uses either SPC vs quad cane); no recent falls reported; works.       Extremity/Trunk Assessment   Upper Extremity Assessment Upper Extremity Assessment: Overall WFL for tasks assessed    Lower Extremity Assessment Lower Extremity Assessment: LLE deficits/detail (R LE WFL) LLE Deficits / Details: at least 3-/5 hip flexion; at least 3/5 AROM knee flexion/extension and DF LLE: Unable to fully assess due to pain    Cervical / Trunk Assessment Cervical / Trunk Assessment: Normal  Communication   Communication Communication: No apparent difficulties    Cognition Arousal: Alert Behavior During Therapy: WFL for tasks assessed/performed   PT - Cognitive impairments: No apparent impairments                         Following commands: Intact       Cueing Cueing Techniques: Verbal cues     General Comments General comments (skin integrity, edema, etc.): L hip hemovac drain and dressing appearing in place beginning/end of session    Exercises Total Joint Exercises Ankle Circles/Pumps: AROM, Strengthening, Both, 10 reps, Supine Quad Sets: AROM, Strengthening, Left, 10 reps, Supine Short Arc  Quad: AAROM, Strengthening, Left, 10 reps, Supine Heel Slides: AAROM, Strengthening, Left, 10 reps, Supine Hip ABduction/ADduction: AAROM, Strengthening, Left, 10 reps, Supine   Assessment/Plan    PT Assessment Patient needs continued PT services  PT Problem List Decreased strength;Decreased activity tolerance;Decreased balance;Decreased mobility;Decreased knowledge of use of DME;Decreased knowledge of precautions;Decreased skin integrity;Pain       PT Treatment Interventions DME instruction;Gait training;Stair training;Functional mobility training;Therapeutic activities;Therapeutic exercise;Balance training;Patient/family education    PT Goals (Current goals can be found in the Care Plan section)  Acute Rehab PT Goals Patient Stated Goal: to improve pain and walking PT Goal Formulation: With patient Time For Goal Achievement: 05/08/24 Potential to Achieve Goals: Good    Frequency BID     Co-evaluation               AM-PAC PT 6 Clicks Mobility  Outcome Measure Help needed turning from your back to your side while in a flat bed without using bedrails?: None Help needed moving from lying on your back to sitting on the side of a flat bed without using bedrails?: A Little Help needed moving to and from a bed to a chair (including a wheelchair)?: A Little Help needed standing up from a chair using your arms (e.g., wheelchair or bedside chair)?: A Little Help needed to walk in hospital room?: A Little Help needed climbing 3-5 steps with a  railing? : A Little 6 Click Score: 19    End of Session Equipment Utilized During Treatment: Gait belt Activity Tolerance: Patient tolerated treatment well Patient left: in chair;with call bell/phone within reach;with SCD's reapplied;Other (comment) (B heels floating via pillows and pillows between LE's for posterior THP's) Nurse Communication: Mobility status;Precautions;Weight bearing status;Other (comment) (Pt's pain status; pt able to  toilet (urinate) during session) PT Visit Diagnosis: Other abnormalities of gait and mobility (R26.89);Muscle weakness (generalized) (M62.81);Pain Pain - Right/Left: Left Pain - part of body: Hip;Leg    Time: 8361-8281 PT Time Calculation (min) (ACUTE ONLY): 40 min   Charges:   PT Evaluation $PT Eval Low Complexity: 1 Low PT Treatments $Therapeutic Exercise: 8-22 mins $Therapeutic Activity: 8-22 mins PT General Charges $$ ACUTE PT VISIT: 1 Visit        Damien Caulk, PT 04/24/24, 5:43 PM

## 2024-04-25 ENCOUNTER — Encounter: Payer: Self-pay | Admitting: Orthopedic Surgery

## 2024-04-25 DIAGNOSIS — Z79899 Other long term (current) drug therapy: Secondary | ICD-10-CM | POA: Diagnosis not present

## 2024-04-25 DIAGNOSIS — M1612 Unilateral primary osteoarthritis, left hip: Secondary | ICD-10-CM | POA: Diagnosis not present

## 2024-04-25 DIAGNOSIS — I10 Essential (primary) hypertension: Secondary | ICD-10-CM | POA: Diagnosis not present

## 2024-04-25 LAB — GLUCOSE, CAPILLARY
Glucose-Capillary: 138 mg/dL — ABNORMAL HIGH (ref 70–99)
Glucose-Capillary: 130 mg/dL — ABNORMAL HIGH (ref 70–99)
Glucose-Capillary: 123 mg/dL — ABNORMAL HIGH (ref 70–99)
Glucose-Capillary: 141 mg/dL — ABNORMAL HIGH (ref 70–99)

## 2024-04-25 MED ORDER — SODIUM CHLORIDE 0.9 % IV BOLUS
500.0000 mL | Freq: Once | INTRAVENOUS | Status: AC
  Administered 2024-04-25: 500 mL via INTRAVENOUS

## 2024-04-25 NOTE — Evaluation (Signed)
 Occupational Therapy Evaluation Patient Details Name: Janet Walters MRN: [id number] DOB: October 04, 1961 Today's Date: 04/25/2024   History of Present Illness   Pt is a 63 y.o. female s/p L THA secondary to degenerative arthrosis L hip 04/24/24.  PMH includes htn, DM, chronic back pain, insomnia, R THA (anterior approach).     Clinical Impressions Pt was seen for OT evaluation this date. PTA, pt resides at home alone where she is typically IND using a cane to ambulate and still works. Denies falls.  Pt presents to acute OT demonstrating impaired ADL performance and functional mobility 2/2 weakness and pain. Pt seated on toilet on OT entry, and demo toilet transfer to [facility] over toilet with CGA/SBA and supervision for peri-care. She stood at the sink to perform grooming tasks with unilateral support with CGA/SBA and no LOB noted. As pt was ambulating back to the bed she reported increased weakness. Once seated EOB she reported feeling like she was going to go out. OT assisted pt to supine position with Mod A for BLE management and BP was monitored and noted to be 70's/50's. Nurse notified with MD present outside of the room to get a bolus and breakfast to come soon. OT educated on posterior hip precautions and all AE/AD/DME use for ADL performance following hip precautions with pt verbalizing understanding. OT to return for further ADL education and performance with improvement in BP. Pt would benefit from skilled OT services to address noted impairments and functional limitations to maximize safety and independence while minimizing falls risk and caregiver burden. Do anticipate the need for follow up OT services upon acute hospital DC.      If plan is discharge home, recommend the following:   A little help with walking and/or transfers;A little help with bathing/dressing/bathroom;Help with stairs or ramp for entrance;Assist for transportation;Assistance with cooking/housework     Functional Status  Assessment   Patient has had a recent decline in their functional status and demonstrates the ability to make significant improvements in function in a reasonable and predictable amount of time.     Equipment Recommendations   None recommended by OT;Other (comment) (has needed DME, needs long handled sponge, sock aide and shoe horn)     Recommendations for Other Services         Precautions/Restrictions   Precautions Precautions: Posterior Hip;Fall Precaution Booklet Issued: Yes (comment) Recall of Precautions/Restrictions: Intact Restrictions Weight Bearing Restrictions Per Provider Order: Yes LLE Weight Bearing Per Provider Order: Weight bearing as tolerated     Mobility Bed Mobility Overal bed mobility: Needs Assistance Bed Mobility: Sit to Supine       Sit to supine: Mod assist, Min assist   General bed mobility comments: for BLE management d/t feeling weak and dizzy with noted hypotension    Transfers Overall transfer level: Needs assistance Equipment used: Rolling walker (2 wheels) Transfers: Sit to/from Stand Sit to Stand: Contact guard assist, Supervision           General transfer comment: from toilet, ambulated from the bathroom with CGA using RW      Balance Overall balance assessment: Needs assistance Sitting-balance support: No upper extremity supported, Feet supported Sitting balance-Leahy Scale: Good     Standing balance support: Bilateral upper extremity supported, During functional activity, Reliant on assistive device for balance Standing balance-Leahy Scale: Good Standing balance comment: steady ambulating with RW use and able to perform standing grooming tasks with unilateral support and no LOB  ADL either performed or assessed with clinical judgement   ADL Overall ADL's : Needs assistance/impaired     Grooming: Wash/dry face;Wash/dry hands;Oral care;Standing;Supervision/safety                    Toilet Transfer: Supervision/safety;BSC/3in1;Rolling walker (2 wheels);Regular Teacher, adult education Details (indicate cue type and reason): BSC over toilet Toileting- Clothing Manipulation and Hygiene: Supervision/safety;Sitting/lateral lean       Functional mobility during ADLs: Contact guard assist;Supervision/safety;Rolling walker (2 wheels)       Vision         Perception         Praxis         Pertinent Vitals/Pain Pain Assessment Pain Assessment: Faces Faces Pain Scale: Hurts little more Pain Location: L hip and L shin Pain Descriptors / Indicators: Aching, Sore, Tender Pain Intervention(s): Limited activity within patient's tolerance, Monitored during session, Repositioned     Extremity/Trunk Assessment Upper Extremity Assessment Upper Extremity Assessment: Overall WFL for tasks assessed   Lower Extremity Assessment Lower Extremity Assessment: Generalized weakness LLE Deficits / Details: anticipate weakness s/p L THA       Communication Communication Communication: No apparent difficulties   Cognition Arousal: Alert Behavior During Therapy: WFL for tasks assessed/performed                                 Following commands: Intact       Cueing  General Comments   Cueing Techniques: Verbal cues  L hip hemovac and dressing intact pre/post session   Exercises Other Exercises Other Exercises: Edu on role of OT in acute setting and posterior hip precautions, AE/AD to maximize ease and independence with ADL performance while following THPs.   Shoulder Instructions      Home Living Family/patient expects to be discharged to:: Private residence Living Arrangements: Alone   Type of Home: House Home Access: Stairs to enter [employer] of Steps: 5 Entrance Stairs-Rails: Right;Left;Can reach both Home Layout: One level     Bathroom Shower/Tub: Chief Strategy Officer: Handicapped height      Home Equipment: Educational psychologist (2 wheels);BSC/3in1;Cane - quad;Cane - single point;Adaptive equipment Adaptive Equipment: Reacher        Prior Functioning/Environment Prior Level of Function : Independent/Modified Independent             Mobility Comments: Pt ambulatory with cane (uses either SPC vs quad cane); no recent falls reported; works. ADLs Comments: IND    OT Problem List: Decreased strength;Impaired balance (sitting and/or standing);Pain   OT Treatment/Interventions: Self-care/ADL training;Therapeutic exercise;Therapeutic activities;Energy conservation;Patient/family education;DME and/or AE instruction;Balance training      OT Goals(Current goals can be found in the care plan section)   Acute Rehab OT Goals Patient Stated Goal: return home OT Goal Formulation: With patient Time For Goal Achievement: 05/09/24 Potential to Achieve Goals: Good ADL Goals Pt Will Perform Lower Body Bathing: with modified independence;with supervision;sitting/lateral leans;sit to/from stand;with adaptive equipment Pt Will Perform Lower Body Dressing: with modified independence;with supervision;with adaptive equipment;sitting/lateral leans;sit to/from stand Pt Will Transfer to Toilet: with modified independence;ambulating;regular height toilet;bedside commode;with supervision Pt Will Perform Toileting - Clothing Manipulation and hygiene: with modified independence;sitting/lateral leans;sit to/from stand   OT Frequency:  Min 3X/week    Co-evaluation              AM-PAC OT 6 Clicks Daily Activity     Outcome Measure  Help from another person eating meals?: None Help from another person taking care of personal grooming?: None Help from another person toileting, which includes using toliet, bedpan, or urinal?: A Little Help from another person bathing (including washing, rinsing, drying)?: A Little Help from another person to put on and taking off regular upper body  clothing?: A Little Help from another person to put on and taking off regular lower body clothing?: A Little 6 Click Score: 20   End of Session Equipment Utilized During Treatment: Rolling walker (2 wheels) Nurse Communication: Mobility status  Activity Tolerance: Patient tolerated treatment well;Treatment limited secondary to medical complications (Comment) (hypotension) Patient left: in bed;with call bell/phone within reach;with nursing/sitter in room  OT Visit Diagnosis: Other abnormalities of gait and mobility (R26.89);Muscle weakness (generalized) (M62.81)                Time: 9254-9199 OT Time Calculation (min): 15 min Charges:  OT General Charges $OT Visit: 1 Visit OT Evaluation $OT Eval Moderate Complexity: 1 Mod Georgeana Oertel, OTR/L 04/25/24, 9:53 AM  Duwaine FORBES Saupe 04/25/2024, 9:48 AM

## 2024-04-25 NOTE — Anesthesia Postprocedure Evaluation (Signed)
 Anesthesia Post Note  Patient: Tram Wrenn Akhavan  Procedure(s) Performed: ARTHROPLASTY, HIP, TOTAL,POSTERIOR APPROACH (Left: Hip)  Patient location during evaluation: PACU Anesthesia Type: Spinal Level of consciousness: oriented and awake and alert Pain management: pain level controlled Vital Signs Assessment: post-procedure vital signs reviewed and stable Respiratory status: spontaneous breathing, respiratory function stable and patient connected to nasal cannula oxygen Cardiovascular status: blood pressure returned to baseline and stable Postop Assessment: no headache, no backache and no apparent nausea or vomiting Anesthetic complications: no   No notable events documented.   Last Vitals:  Vitals:   04/24/24 2354 04/25/24 0511  BP: 120/70 115/70  Pulse: 70 70  Resp: 16 18  Temp: 36.8 C 36.9 C  SpO2: 99% 100%    Last Pain:  Vitals:   04/25/24 0600  TempSrc:   PainSc: Asleep                 Alfrieda LILLETTE Lan

## 2024-04-25 NOTE — Progress Notes (Signed)
 Subjective: 1 Janet Walters Post-Op Procedure(s) (LRB): ARTHROPLASTY, HIP, TOTAL,POSTERIOR APPROACH (Left) Patient reports pain as moderate.   Patient seen in rounds with Dr. Mardee. Patient is well, but has had some minor complaints of dizziness while up walking with OT. BP was found to be low. Has improved in symptoms since returning to bed. Denies any CP, SOB, N/V, fevers or chills We will continue with therapy today when appropriate Plan is to go Home after hospital stay.  Objective: Vital signs in last 24 hours: Temp:  [97.3 F (36.3 C)-98.4 F (36.9 C)] 97.8 F (36.6 C) (07/15 0734) Pulse Rate:  [64-81] 76 (07/15 0734) Resp:  [13-18] 16 (07/15 0734) BP: (72-139)/(43-84) 90/60 (07/15 0809) SpO2:  [98 %-100 %] 98 % (07/15 0734)  Intake/Output from previous [place]:  Intake/Output Summary (Last 24 hours) at 04/25/2024 0831 Last data filed at 04/25/2024 0500 Gross per 24 hour  Intake 1840.77 ml  Output 700 ml  Net 1140.77 ml    Intake/Output this shift: No intake/output data recorded.  Labs: No results for input(s): HGB in the last 72 hours. No results for input(s): WBC, RBC, HCT, PLT in the last 72 hours. No results for input(s): NA, K, CL, CO2, BUN, CREATININE, GLUCOSE, CALCIUM in the last 72 hours. No results for input(s): LABPT, INR in the last 72 hours.  EXAM General - Patient is Alert, Appropriate, and Oriented Extremity - Neurologically intact Neurovascular intact Sensation intact distally Intact pulses distally Dorsiflexion/Plantar flexion intact No cellulitis present Compartment soft Dressing - dressing C/D/I and no drainage Motor Function - intact, moving foot and toes well on exam.  JP Drain remains in place  Past Medical History:  Diagnosis Date   Back pain, chronic    DDD (degenerative disc disease), lumbar    Diabetes mellitus without complication (HCC)    Genital herpes    GERD (gastroesophageal reflux disease)    Hypertension     Insomnia     Assessment/Plan: 1 Janet Walters Post-Op Procedure(s) (LRB): ARTHROPLASTY, HIP, TOTAL,POSTERIOR APPROACH (Left) Principal Problem:   Hx of total hip arthroplasty, left  Estimated body mass index is 37.61 kg/m as calculated from the following:   Height as of this encounter: 5' 6 (1.676 m).   Weight as of this encounter: 105.7 kg. Advance diet Up with therapy when tolerated and improved BP  Patient had dizziness and slight drop in BP while working with OT this AM. Given a 250 CC bolus, continue to monitor symptoms  Patient will continue to work with physical therapy to pass postoperative PT protocols, ROM and strengthening  Hip Preacutions  Discussed with the patient continuing to utilize ice over the bandage  Patient will wear TED hose bilaterally to help prevent DVT and clot formation  Discussed the Aquacel bandage.  This bandage will stay in place 7 days postoperatively.  Can be replaced with honeycomb bandages that will be sent home with the patient  Discussed sending the patient home with tramadol  and oxycodone  for as needed pain management.  Patient will also be sent home with Celebrex  to help with swelling and inflammation.  Patient will take an 81 mg aspirin  twice daily for DVT prophylaxis  JP drain remains in place, will look to remove later today  Weight-Bearing as tolerated to left leg  Patient will follow-up with [facility] clinic orthopedics in 6 weeks for re-imaging and reevaluation   Fonda Koyanagi, PA-C [facility] 04/25/2024, 8:31 AM

## 2024-04-25 NOTE — Plan of Care (Signed)
  Problem: Skin Integrity: Goal: Risk for impaired skin integrity will decrease Outcome: Progressing   Problem: Activity: Goal: Ability to tolerate increased activity will improve Outcome: Progressing   Problem: Pain Management: Goal: Pain level will decrease with appropriate interventions Outcome: Progressing   

## 2024-04-25 NOTE — Progress Notes (Addendum)
 Subjective: 1 Janet Walters Post-Op Procedure(s) (LRB): ARTHROPLASTY, HIP, TOTAL,POSTERIOR APPROACH (Left) Patient reports pain as moderate.   Patient seen in rounds with Dr. Mardee. Patient is well, but does report being tired. She states PT seemed to go better this afternoon, did not become as dizzy, but still had some lightheaded symptoms. Denies any CP, SOB, N/V, fevers or chills We will continue with therapy today, continue to monitor blood pressure Plan is to go Home after hospital stay.  Objective: Vital signs in last 24 hours: Temp:  [97.3 F (36.3 C)-98.4 F (36.9 C)] 97.8 F (36.6 C) (07/15 0734) Pulse Rate:  [63-76] 63 (07/15 1021) Resp:  [13-18] 16 (07/15 0734) BP: (72-144)/(43-84) 125/67 (07/15 1200) SpO2:  [98 %-100 %] 100 % (07/15 1021)  Intake/Output from previous [place]:  Intake/Output Summary (Last 24 hours) at 04/25/2024 1313 Last data filed at 04/25/2024 0500 Gross per 24 hour  Intake 640.77 ml  Output 270 ml  Net 370.77 ml    Intake/Output this shift: No intake/output data recorded.  Labs: No results for input(s): HGB in the last 72 hours. No results for input(s): WBC, RBC, HCT, PLT in the last 72 hours. No results for input(s): NA, K, CL, CO2, BUN, CREATININE, GLUCOSE, CALCIUM in the last 72 hours. No results for input(s): LABPT, INR in the last 72 hours.  EXAM General - Patient is Alert, Appropriate, and Oriented Extremity - Neurologically intact Neurovascular intact Sensation intact distally Intact pulses distally Dorsiflexion/Plantar flexion intact No cellulitis present Compartment soft Dressing - dressing C/D/I and no drainage Motor Function - intact, moving foot and toes well on exam.  JP Drain removed without difficulty, intact  Past Medical History:  Diagnosis Date   Back pain, chronic    DDD (degenerative disc disease), lumbar    Diabetes mellitus without complication (HCC)    Genital herpes    GERD  (gastroesophageal reflux disease)    Hypertension    Insomnia     Assessment/Plan: 1 [place] Post-Op Procedure(s) (LRB): ARTHROPLASTY, HIP, TOTAL,POSTERIOR APPROACH (Left) Principal Problem:   Hx of total hip arthroplasty, left  Estimated body mass index is 37.61 kg/m as calculated from the following:   Height as of this encounter: 5' 6 (1.676 m).   Weight as of this encounter: 105.7 kg. Advance diet Up with therapy when tolerated and improved BP  Patient had improved dizziness symptoms but maintained a noticeable drop in BP while working with PT. Able to pass protocols, just noted to be very slow and tired  Patient will continue to work with physical therapy to pass postoperative PT protocols, ROM and strengthening. Discussed transition to HHPT vs Outpatient PT  Hip Preacutions  Discussed with the patient continuing to utilize ice over the bandage  Patient will wear TED hose bilaterally to help prevent DVT and clot formation  Discussed the Aquacel bandage.  This bandage will stay in place 7 days postoperatively.  Can be replaced with honeycomb bandages that will be sent home with the patient  Discussed sending the patient home with tramadol  and oxycodone  for as needed pain management.  Patient will also be sent home with Celebrex  to help with swelling and inflammation.  Patient will take an 81 mg aspirin  twice daily for DVT prophylaxis  JP drain removed without difficulty, intact  Weight-Bearing as tolerated to left leg  Will plan to hold for one night with monitoring of symptoms and blood pressures  Patient will follow-up with [facility] clinic orthopedics in 6 weeks for re-imaging and reevaluation  Fonda Koyanagi, PA-C [facility] 04/25/2024, 1:13 PM

## 2024-04-25 NOTE — Progress Notes (Signed)
 Physical Therapy Treatment Patient Details Name: Janet Walters Hocker MRN: [id number] DOB: Mar 27, 1961 Today's Date: 04/25/2024   History of Present Illness Pt is a 63 y.o. female s/p L THA secondary to degenerative arthrosis L hip 04/24/24.  PMH includes htn, DM, chronic back pain, insomnia, R THA (anterior approach).    PT Comments  Pt in chair.  Bolus completed.   Supine 120/59 P 67 Sitting 108/67 P 71 Standing 96/72 P 78 Standing 3 125/67 P 82 Some dizziness reported relieved with time.  She is able to walk to steps and complete stair training and back to room with slow steady gait.  Pt does report feeling sleepy BS checked prior and 130.  Pt stated she lives alone but brother will be in and out.  Discussed safety at home.  Pt does request OPPT and will really to team.  Stated she feels more motivated having to leave the house for therapy.  Brother can transport.   If plan is discharge home, recommend the following: A little help with walking and/or transfers;A little help with bathing/dressing/bathroom;Assistance with cooking/housework;Assist for transportation;Help with stairs or ramp for entrance   Can travel by private vehicle        Equipment Recommendations  None recommended by PT (pt has RW and toilet riser at home already)    Recommendations for Other Services       Precautions / Restrictions Precautions Precautions: Posterior Hip;Fall Precaution Booklet Issued: Yes (comment) Recall of Precautions/Restrictions: Intact Restrictions Weight Bearing Restrictions Per Provider Order: Yes LLE Weight Bearing Per Provider Order: Weight bearing as tolerated     Mobility  Bed Mobility               General bed mobility comments: in reclnier before and after Patient Response: Cooperative  Transfers Overall transfer level: Needs assistance Equipment used: Rolling walker (2 wheels) Transfers: Sit to/from Stand Sit to Stand: Contact guard assist, Supervision                 Ambulation/Gait Ambulation/Gait assistance: Contact guard assist, Supervision Gait Distance (Feet): 200 Feet Assistive device: Rolling walker (2 wheels) Gait Pattern/deviations: Step-through pattern Gait velocity: decreased     General Gait Details: generally stedy but slow   Stairs Stairs: Yes Stairs assistance: Contact guard assist, Supervision Stair Management: Two rails Number of Stairs: 4     Wheelchair Mobility     Tilt Bed Tilt Bed Patient Response: Cooperative  Modified Rankin (Stroke Patients Only)       Balance Overall balance assessment: Mild deficits observed, not formally tested                                          Communication Communication Communication: No apparent difficulties  Cognition Arousal: Alert Behavior During Therapy: WFL for tasks assessed/performed   PT - Cognitive impairments: No apparent impairments                         Following commands: Intact      Cueing Cueing Techniques: Verbal cues  Exercises      General Comments General comments (skin integrity, edema, etc.): Blood noted on bed; nursed addressed L thigh hemovac      Pertinent Vitals/Pain Pain Assessment Pain Assessment: Faces Faces Pain Scale: Hurts even more Pain Location: L hip - limited pain meds due to BP earlier Pain Descriptors /  Indicators: Aching, Sore, Tender Pain Intervention(s): Limited activity within patient's tolerance, Monitored during session, Repositioned    Home Living Family/patient expects to be discharged to:: Private residence Living Arrangements: Alone   Type of Home: House Home Access: Stairs to enter Entrance Stairs-Rails: Right;Left;Can reach both Entrance Stairs-Number of Steps: 5   Home Layout: One level Home Equipment: Educational psychologist (2 wheels);BSC/3in1;Cane - quad;Cane - single point;Adaptive equipment      Prior Function            PT Goals (current goals can now  be found in the care plan section) Acute Rehab PT Goals Patient Stated Goal: to improve pain and walking PT Goal Formulation: With patient Time For Goal Achievement: 05/08/24 Potential to Achieve Goals: Good Progress towards PT goals: Progressing toward goals    Frequency    BID      PT Plan      Co-evaluation              AM-PAC PT 6 Clicks Mobility   Outcome Measure  Help needed turning from your back to your side while in a flat bed without using bedrails?: None Help needed moving from lying on your back to sitting on the side of a flat bed without using bedrails?: None Help needed moving to and from a bed to a chair (including a wheelchair)?: None Help needed standing up from a chair using your arms (e.g., wheelchair or bedside chair)?: None Help needed to walk in hospital room?: A Little Help needed climbing 3-5 steps with a railing? : A Little 6 Click Score: 22    End of Session Equipment Utilized During Treatment: Gait belt Activity Tolerance: Patient limited by pain;Patient tolerated treatment well (Pt reporting feeling really tired) Patient left: in chair;with call bell/phone within reach (with nursing present attending to pt) Nurse Communication: Mobility status;Precautions;Weight bearing status;Other (comment) (pt's pain and BP; pt's overall status) PT Visit Diagnosis: Other abnormalities of gait and mobility (R26.89);Muscle weakness (generalized) (M62.81);Pain Pain - Right/Left: Left Pain - part of body: Hip;Leg     Time: 8856-8777 PT Time Calculation (min) (ACUTE ONLY): 39 min  Charges:    $Gait Training: 38-52 mins $Therapeutic Exercise: 8-22 mins PT General Charges $$ ACUTE PT VISIT: 1 Visit                    Lauraine Gills, PTA 04/25/24, 12:39 PM

## 2024-04-25 NOTE — Progress Notes (Signed)
 Pts BP 117/62 after NS bolus, MD Hooten made aware.

## 2024-04-25 NOTE — Progress Notes (Signed)
 Pts BP 72/43, MD Hooten at bedside.  Orders received for 500 ml NS bolus.

## 2024-04-25 NOTE — Plan of Care (Signed)
 Progressing towards discharge

## 2024-04-25 NOTE — Progress Notes (Signed)
 Occupational Therapy Treatment Patient Details Name: Ciclaly Mulcahey Besaw MRN: [id number] DOB: 03-10-1961 Today's Date: 04/25/2024   History of present illness Pt is a 63 y.o. female s/p L THA secondary to degenerative arthrosis L hip 04/24/24.  PMH includes htn, DM, chronic back pain, insomnia, R THA (anterior approach).   OT comments  Pt is getting off toilet with NT on arrival. Pleasant and agreeable to OT session feeling better this PM. She does not c/o pain during session, but noted grimacing at times with LLE movement. Pt performed standing hand hygiene with SBA at sink and ambulated back to EOB with RW and SBA. Pt recalled 3/3 posterior hip precautions and was educated on use of long handled sponge, sock aide, shoe horn and reacher for ADL performance to adhere to hip precautions. She demo back LB dressing tasks using reacher and sock aide with CGA. Pt also utilized gait belt as leg lifter to return to supine at end of session with supervision and no difficulty. Pt reports her brother will assist with compression stocking management and can try to find a sock aide and shoe horn for her to utilize at home as she already has sponge and reacher.  Pt left with all needs in place and will cont to require skilled acute OT services to maximize his safety and IND to return to PLOF.       If plan is discharge home, recommend the following:  A little help with walking and/or transfers;A little help with bathing/dressing/bathroom;Help with stairs or ramp for entrance;Assist for transportation;Assistance with cooking/housework   Equipment Recommendations  Other (comment) (needs sock aide and shoe horn)    Recommendations for Other Services      Precautions / Restrictions Precautions Precautions: Posterior Hip;Fall Precaution Booklet Issued: Yes (comment) Recall of Precautions/Restrictions: Intact Restrictions Weight Bearing Restrictions Per Provider Order: Yes LLE Weight Bearing Per Provider Order:  Weight bearing as tolerated       Mobility Bed Mobility Overal bed mobility: Needs Assistance Bed Mobility: Sit to Supine       Sit to supine: Supervision   General bed mobility comments: utilized gait belt as leg lifter to return to supine without difficulty    Transfers Overall transfer level: Needs assistance Equipment used: Rolling walker (2 wheels) Transfers: Sit to/from Stand Sit to Stand: Contact guard assist, Supervision           General transfer comment: from EOB x2 with SBA     Balance Overall balance assessment: Mild deficits observed, not formally tested Sitting-balance support: No upper extremity supported, Feet supported Sitting balance-Leahy Scale: Good Sitting balance - Comments: steady reaching within BOS   Standing balance support: Bilateral upper extremity supported, During functional activity, Reliant on assistive device for balance Standing balance-Leahy Scale: Good Standing balance comment: RW use, steady                           ADL either performed or assessed with clinical judgement   ADL Overall ADL's : Needs assistance/impaired     Grooming: Wash/dry hands;Standing;Contact guard assist;Supervision/safety               Lower Body Dressing: Sitting/lateral leans;Sit to/from stand;Adhering to hip precautions;Contact guard assist;With adaptive equipment Lower Body Dressing Details (indicate cue type and reason): using reach to don mesh underwear at EOB and sock aide to don socks             Functional mobility during ADLs: Contact  guard assist;Supervision/safety;Rolling walker (2 wheels)      Extremity/Trunk Assessment              Vision       Perception     Praxis     Communication Communication Communication: No apparent difficulties   Cognition Arousal: Alert Behavior During Therapy: WFL for tasks assessed/performed                                 Following commands: Intact         Cueing   Cueing Techniques: Verbal cues  Exercises Other Exercises Other Exercises: Edu on role of OT in acute setting and posterior hip precautions, AE/AD to maximize ease and independence with ADL performance while following THPs.    Shoulder Instructions       General Comments      Pertinent Vitals/ Pain       Pain Assessment Pain Assessment: Faces Faces Pain Scale: Hurts a little bit Pain Location: L hip Pain Descriptors / Indicators: Aching, Sore, Tender Pain Intervention(s): Limited activity within patient's tolerance, Monitored during session, Repositioned  Home Living                                          Prior Functioning/Environment              Frequency  Min 3X/week        Progress Toward Goals  OT Goals(current goals can now be found in the care plan section)  Progress towards OT goals: Progressing toward goals  Acute Rehab OT Goals Patient Stated Goal: go home OT Goal Formulation: With patient Time For Goal Achievement: 05/09/24 Potential to Achieve Goals: Good  Plan      Co-evaluation                 AM-PAC OT 6 Clicks Daily Activity     Outcome Measure   Help from another person eating meals?: None   Help from another person toileting, which includes using toliet, bedpan, or urinal?: A Little Help from another person bathing (including washing, rinsing, drying)?: A Little Help from another person to put on and taking off regular upper body clothing?: None Help from another person to put on and taking off regular lower body clothing?: A Little 6 Click Score: 17    End of Session Equipment Utilized During Treatment: Rolling walker (2 wheels)  OT Visit Diagnosis: Other abnormalities of gait and mobility (R26.89);Muscle weakness (generalized) (M62.81)   Activity Tolerance Patient tolerated treatment well   Patient Left in bed;with call bell/phone within reach;with bed alarm set;with family/visitor  present   Nurse Communication Mobility status        Time: 8363-8295 OT Time Calculation (min): 28 min  Charges: OT General Charges $OT Visit: 1 Visit OT Treatments $Self Care/Home Management : 23-37 mins  Khrystyne Arpin, OTR/L  04/25/24, 5:19 PM   Atlas [address] 04/25/2024, 5:17 PM

## 2024-04-25 NOTE — Progress Notes (Signed)
 Physical Therapy Treatment Patient Details Name: Janet Walters MRN: [id number] DOB: September 01, 1961 Today's Date: 04/25/2024   History of Present Illness Pt is a 63 y.o. female s/p L THA secondary to degenerative arthrosis L hip 04/24/24.  PMH includes htn, DM, chronic back pain, insomnia, R THA (anterior approach).    PT Comments  Pt resting/sleeping in bed upon PT arrival; pt agreeable to therapy but appearing very tired; BP 130/66 (MAP 85) with HR 63 bpm resting in bed.  Reviewed posterior THP's (would benefit from review) and performed LE total hip replacement HEP (pt issued written HEP) semi-supine in bed.  Pt appearing tired and requiring extra time and cueing for ex's; pt also c/o pain with ex's requiring increased assist.  Deferred further activity d/t pt's pain and pt c/o feeling really tired (pt did not feel like she could do much).  Will return later for additional session for OOB functional mobility; nurse notified.    If plan is discharge home, recommend the following: A little help with walking and/or transfers;A little help with bathing/dressing/bathroom;Assistance with cooking/housework;Assist for transportation;Help with stairs or ramp for entrance   Can travel by private vehicle        Equipment Recommendations  None recommended by PT (pt has RW and toilet riser at home already)    Recommendations for Other Services       Precautions / Restrictions Precautions Precautions: Posterior Hip;Fall Precaution Booklet Issued: Yes (comment) Recall of Precautions/Restrictions: Intact Restrictions Weight Bearing Restrictions Per Provider Order: Yes LLE Weight Bearing Per Provider Order: Weight bearing as tolerated     Mobility  Bed Mobility               General bed mobility comments: Deferred    Transfers                        Ambulation/Gait                   Stairs             Wheelchair Mobility     Tilt Bed    Modified Rankin  (Stroke Patients Only)       Balance                                            Communication Communication Communication: No apparent difficulties  Cognition Arousal: Alert Behavior During Therapy: WFL for tasks assessed/performed   PT - Cognitive impairments: No apparent impairments                         Following commands: Intact      Cueing Cueing Techniques: Verbal cues  Exercises Total Joint Exercises Ankle Circles/Pumps: AROM, Strengthening, Both, 10 reps, Supine Quad Sets: AROM, Strengthening, Left, 10 reps, Supine Gluteal Sets: AROM, Strengthening, Both, 10 reps, Supine Towel Squeeze: AROM, Strengthening, Both, 10 reps, Supine Short Arc Quad: AROM, Strengthening, Left, 10 reps, Supine Heel Slides: AAROM, Strengthening, Left, 10 reps, Supine Hip ABduction/ADduction: AAROM, Strengthening, Left, 10 reps, Supine    General Comments General comments (skin integrity, edema, etc.): Blood noted on bed; nursed addressed L thigh hemovac.  Nursing cleared pt for participation in physical therapy.  Pt agreeable to PT session.      Pertinent Vitals/Pain Pain Assessment Pain Assessment: 0-10 Pain Score: 5  Pain  Location: L hip Pain Descriptors / Indicators: Aching, Sore, Tender Pain Intervention(s): Limited activity within patient's tolerance, Monitored during session, Other (comment) (Nurse present and aware)    Home Living Family/patient expects to be discharged to:: Private residence Living Arrangements: Alone   Type of Home: House Home Access: Stairs to enter Entrance Stairs-Rails: Right;Left;Can reach both Entrance Stairs-Number of Steps: 5   Home Layout: One level Home Equipment: Educational psychologist (2 wheels);BSC/3in1;Cane - quad;Cane - single point;Adaptive equipment      Prior Function            PT Goals (current goals can now be found in the care plan section) Acute Rehab PT Goals Patient Stated Goal: to improve  pain and walking PT Goal Formulation: With patient Time For Goal Achievement: 05/08/24 Potential to Achieve Goals: Good Progress towards PT goals: Progressing toward goals (with LE ex's)    Frequency    BID      PT Plan      Co-evaluation              AM-PAC PT 6 Clicks Mobility   Outcome Measure  Help needed turning from your back to your side while in a flat bed without using bedrails?: None Help needed moving from lying on your back to sitting on the side of a flat bed without using bedrails?: A Little Help needed moving to and from a bed to a chair (including a wheelchair)?: A Little Help needed standing up from a chair using your arms (e.g., wheelchair or bedside chair)?: A Little Help needed to walk in hospital room?: A Little Help needed climbing 3-5 steps with a railing? : A Little 6 Click Score: 19    End of Session Equipment Utilized During Treatment: Gait belt Activity Tolerance: Patient limited by pain;Patient limited by lethargy (Pt reporting feeling really tired) Patient left: in bed (with nursing present attending to pt) Nurse Communication: Mobility status;Precautions;Weight bearing status;Other (comment) (pt's pain and BP; pt's overall status) PT Visit Diagnosis: Other abnormalities of gait and mobility (R26.89);Muscle weakness (generalized) (M62.81);Pain Pain - Right/Left: Left Pain - part of body: Hip;Leg     Time: 8985-8966 PT Time Calculation (min) (ACUTE ONLY): 19 min  Charges:    $Therapeutic Exercise: 8-22 mins PT General Charges $$ ACUTE PT VISIT: 1 Visit                     Damien Caulk, PT 04/25/24, 12:03 PM

## 2024-04-26 DIAGNOSIS — M1612 Unilateral primary osteoarthritis, left hip: Secondary | ICD-10-CM | POA: Diagnosis not present

## 2024-04-26 DIAGNOSIS — I10 Essential (primary) hypertension: Secondary | ICD-10-CM | POA: Diagnosis not present

## 2024-04-26 DIAGNOSIS — Z79899 Other long term (current) drug therapy: Secondary | ICD-10-CM | POA: Diagnosis not present

## 2024-04-26 LAB — GLUCOSE, CAPILLARY: Glucose-Capillary: 121 mg/dL — ABNORMAL HIGH (ref 70–99)

## 2024-04-26 MED ORDER — LISINOPRIL 20 MG PO TABS
ORAL_TABLET | ORAL | Status: AC
  Filled 2024-04-26: qty 1

## 2024-04-26 MED ORDER — ASPIRIN 81 MG PO CHEW: 81.0000 mg | CHEWABLE_TABLET | Freq: Two times a day (BID) | ORAL | Status: AC

## 2024-04-26 MED ORDER — CELECOXIB 200 MG PO CAPS: 200.0000 mg | ORAL_CAPSULE | Freq: Two times a day (BID) | ORAL | 1 refills | Status: AC

## 2024-04-26 MED ORDER — OXYCODONE HCL 5 MG PO TABS: 5.0000 mg | ORAL_TABLET | ORAL | 0 refills | Status: AC | PRN

## 2024-04-26 MED ORDER — TRAMADOL HCL 50 MG PO TABS: 50.0000 mg | ORAL_TABLET | Freq: Four times a day (QID) | ORAL | 0 refills | Status: AC | PRN

## 2024-04-26 NOTE — Discharge Summary (Signed)
 Physician Discharge Summary  Subjective: 2 Days Post-Op Procedure(s) (LRB): ARTHROPLASTY, HIP, TOTAL,POSTERIOR APPROACH (Left) Patient reports pain as moderate.   Patient seen in rounds with Dr. Mardee. Patient is well,  and denies having any concerns today. States that she feels like she has more energy and is ready to go home today. Denies any CP, SOB, N/V, fevers or chills Patient is ready to go home  Physician Discharge Summary  Patient ID: Janet Walters MRN: [id number] DOB/AGE: 1961-07-23 63 y.o.  Admit date: 04/24/2024 Discharge date: 04/26/2024  Admission Diagnoses:  Discharge Diagnoses:  Principal Problem:   Hx of total hip arthroplasty, left   Discharged Condition: good  Hospital Course: Patient presented to the hospital on 04/24/2024  for an elective Left total hip arthroplasty. Patient was given 1g of TXA and 2g of Ancef  prior to the procedure. she tolerated the procedure well without any complications. See procedural note below for details. Postoperatively, the patient did very well. she was able to pass PT protocols on post-op [place] one but did have lower BP. Kept one night for monitoring of symptoms and BP. JP drain was removed without any difficulty and was intact. she was able to void her bladder without any difficulty. Physical exam was unremarkable. she denies any SOB, CP, N/V, fevers or chills. Vital signs are stable. Patient is stable to discharge home.  PROCEDURE:  Left total hip arthroplasty   SURGEON:  Lynwood SHAUNNA Mardee Mickey. M.D.   ASSISTANT:  Sidra Koyanagi, PA-C (present and scrubbed throughout the case, critical for assistance with exposure, retraction, instrumentation, and closure)   ANESTHESIA: spinal and general   ESTIMATED BLOOD LOSS: 150 mL   FLUIDS REPLACED: 1200 mL of crystalloid   DRAINS: 2 medium Hemovac drains   IMPLANTS UTILIZED: DePuy size 3 standard offset Actis femoral stem, 52 mm OD Pinnacle 100 acetabular component, +4 mm neutral Pinnacle Altrx  polyethylene insert, and a 36 mm M-SPEC +1.5 mm hip ball    Treatments: 250 CC bolus  Discharge Exam: Blood pressure 133/74, pulse 79, temperature 98.5 F (36.9 C), temperature source Oral, resp. rate 16, height 5' 6 (1.676 m), weight 105.7 kg, SpO2 100%.   Disposition: Discharge disposition: 01-Home or Self Care        Allergies as of 04/26/2024   No Known Allergies      Medication List     STOP taking these medications    ADVIL PM PO   ALEVE  PM PO   Aspirin -Caffeine 1000-65 MG Pack       TAKE these medications    acetaminophen  650 MG CR tablet Commonly known as: TYLENOL  Take 1,300 mg by mouth every 8 (eight) hours as needed for pain.   aspirin  81 MG chewable tablet Chew 1 tablet (81 mg total) by mouth 2 (two) times daily.   celecoxib  200 MG capsule Commonly known as: CELEBREX  Take 1 capsule (200 mg total) by mouth 2 (two) times daily. What changed:  when to take this reasons to take this   famotidine  20 MG tablet Commonly known as: PEPCID  Take 20 mg by mouth 2 (two) times daily as needed for heartburn or indigestion.   gabapentin  300 MG capsule Commonly known as: NEURONTIN  TAKE 1 CAPSULE BY MOUTH DAILY AT BEDTIME   HYDROcodone -acetaminophen  5-325 MG tablet Commonly known as: NORCO/VICODIN Take 1 tablet by mouth every 4 (four) hours as needed for moderate pain (pain score 4-6).   Leg Cramps Tabs Take 1-2 tablets by mouth daily as needed (cramps).  lisinopril -hydrochlorothiazide  20-25 MG tablet Commonly known as: ZESTORETIC  TAKE 1 TABLET BY MOUTH EVERY [place]   methocarbamol  750 MG tablet Commonly known as: Robaxin -750 Take 1 tablet (750 mg total) by mouth every 8 (eight) hours as needed for muscle spasms.   oxyCODONE  5 MG immediate release tablet Commonly known as: Oxy IR/ROXICODONE  Take 1 tablet (5 mg total) by mouth every 4 (four) hours as needed for moderate pain (pain score 4-6) (pain score 4-6).   predniSONE  20 MG tablet Commonly  known as: DELTASONE  Take 1 tablet (20 mg total) by mouth 2 (two) times daily with a meal.   Rybelsus  3 MG Tabs Generic drug: Semaglutide  Take 1 tablet (3 mg total) by mouth daily.   tiZANidine 4 MG tablet Commonly known as: ZANAFLEX Take 4 mg by mouth every 6 (six) hours as needed for muscle spasms.   traMADol  50 MG tablet Commonly known as: ULTRAM  Take 1 tablet (50 mg total) by mouth every 6 (six) hours as needed for moderate pain (pain score 4-6).               Durable Medical Equipment  (From admission, onward)           Start     Ordered   04/24/24 1504  DME Walker rolling  Once       Question:  Patient needs a walker to treat with the following condition  Answer:  S/P total hip arthroplasty   04/24/24 1503   04/24/24 1504  DME Bedside commode  Once       Comments: Patient is not able to walk the distance required to go the bathroom, or he/she is unable to safely negotiate stairs required to access the bathroom.  A 3in1 BSC will alleviate this problem  Question:  Patient needs a bedside commode to treat with the following condition  Answer:  S/P total hip arthroplasty   04/24/24 1503            Follow-up Information     Hooten, Lynwood SQUIBB, MD Follow up on 06/06/2024.   Specialty: Orthopedic Surgery Why: at 2:00pm Contact information: [address] [facility] [place] [address] [phone]                 Signed: Sidra Koyanagi 04/26/2024, 7:36 AM   Objective: Vital signs in last 24 hours: Temp:  [98 F (36.7 C)-98.5 F (36.9 C)] 98.5 F (36.9 C) (07/16 0720) Pulse Rate:  [63-79] 79 (07/16 0720) Resp:  [16-17] 16 (07/16 0720) BP: (62-148)/(39-79) 133/74 (07/16 0720) SpO2:  [96 %-100 %] 100 % (07/16 0720)  Intake/Output from previous [place]:  Intake/Output Summary (Last 24 hours) at 04/26/2024 0736 Last data filed at 04/25/2024 1504 Gross per 24 hour  Intake 100 ml  Output --  Net 100 ml    Intake/Output this  shift: No intake/output data recorded.  Labs: No results for input(s): HGB in the last 72 hours. No results for input(s): WBC, RBC, HCT, PLT in the last 72 hours. No results for input(s): NA, K, CL, CO2, BUN, CREATININE, GLUCOSE, CALCIUM in the last 72 hours. No results for input(s): LABPT, INR in the last 72 hours.  EXAM: General - Patient is Alert, Appropriate, and Oriented Extremity - Neurologically intact Neurovascular intact Sensation intact distally Intact pulses distally Dorsiflexion/Plantar flexion intact No cellulitis present Compartment soft Dressing - dressing C/D/I and no drainage Motor Function - intact, moving foot and toes well on exam.   Assessment/Plan: 2 Days Post-Op  Procedure(s) (LRB): ARTHROPLASTY, HIP, TOTAL,POSTERIOR APPROACH (Left) Procedure(s) (LRB): ARTHROPLASTY, HIP, TOTAL,POSTERIOR APPROACH (Left) Past Medical History:  Diagnosis Date   Back pain, chronic    DDD (degenerative disc disease), lumbar    Diabetes mellitus without complication (HCC)    Genital herpes    GERD (gastroesophageal reflux disease)    Hypertension    Insomnia    Principal Problem:   Hx of total hip arthroplasty, left  Estimated body mass index is 37.61 kg/m as calculated from the following:   Height as of this encounter: 5' 6 (1.676 m).   Weight as of this encounter: 105.7 kg.  Patient had improved dizziness symptoms and energy has seemed to improve, Bps stable   Patient will continue to work with physical therapy to pass postoperative PT protocols, ROM and strengthening.    Hip Preacutions   Discussed with the patient continuing to utilize ice over the bandage   Patient will wear TED hose bilaterally to help prevent DVT and clot formation   Discussed the Aquacel bandage.  This bandage will stay in place 7 days postoperatively.  Can be replaced with honeycomb bandages that will be sent home with the patient   Discussed sending the  patient home with tramadol  and oxycodone  for as needed pain management.  Patient will also be sent home with Celebrex  to help with swelling and inflammation.  Patient will take an 81 mg aspirin  twice daily for DVT prophylaxis   JP drain removed without difficulty, intact   Weight-Bearing as tolerated to left leg   Will plan to hold for one night with monitoring of symptoms and blood pressures   Patient will follow-up with [facility] clinic orthopedics in 6 weeks for re-imaging and reevaluation  Diet - Regular diet Follow up - in 6 weeks Activity - WBAT Disposition - Home Condition Upon Discharge - Good DVT Prophylaxis - Aspirin  and TED hose  Fonda CHARLENA Koyanagi, PA-C Orthopaedic Surgery 04/26/2024, 7:36 AM

## 2024-04-26 NOTE — TOC Progression Note (Signed)
 Transition of Care [facility]) - Progression Note    Patient Details  Name: Janet Walters MRN: [id number] Date of Birth: [birth year]  [facility] CM/SW Contact  Delphine KANDICE Bring, RN Phone Number: 04/26/2024, 9:34 AM  Clinical Narrative:    Cm spoke with Mitch at [employer]. He states that patient had received a bedside commode last year. She will have to private pay.  According to Mitch at [employer] , patient was informed on Monday. She decided not to private pay.  CM update nurse        Expected Discharge Plan and Services         Expected Discharge Date: 04/26/24                                     Social Determinants of Health (SDOH) Interventions SDOH Screenings   Food Insecurity: No Food Insecurity (04/24/2024)  Housing: Unknown (04/24/2024)  Transportation Needs: No Transportation Needs (04/24/2024)  Utilities: Not At Risk (04/24/2024)  Alcohol Screen: Low Risk  (04/27/2022)  Depression (PHQ2-9): Low Risk  (11/25/2023)  Financial Resource Strain: Low Risk  (05/13/2023)  Social Connections: Unknown (05/13/2023)  Stress: Stress Concern Present (05/13/2023)  Tobacco Use: Low Risk  (04/24/2024)    Readmission Risk Interventions     No data to display

## 2024-04-26 NOTE — Progress Notes (Signed)
 Subjective: 2 Days Post-Op Procedure(s) (LRB): ARTHROPLASTY, HIP, TOTAL,POSTERIOR APPROACH (Left) Patient reports pain as moderate.   Patient seen in rounds with Dr. Mardee. Patient is well,  and denies having any concerns today. States that she feels like she has more energy and is ready to go home today. Denies any CP, SOB, N/V, fevers or chills We will continue with therapy today, continue to monitor blood pressure Plan is to go Home after hospital stay.  Objective: Vital signs in last 24 hours: Temp:  [97.8 F (36.6 C)-98.5 F (36.9 C)] 98.5 F (36.9 C) (07/16 0720) Pulse Rate:  [63-79] 79 (07/16 0720) Resp:  [16-17] 16 (07/16 0720) BP: (62-148)/(39-79) 133/74 (07/16 0720) SpO2:  [96 %-100 %] 100 % (07/16 0720)  Intake/Output from previous [place]:  Intake/Output Summary (Last 24 hours) at 04/26/2024 0729 Last data filed at 04/25/2024 1504 Gross per 24 hour  Intake 100 ml  Output --  Net 100 ml    Intake/Output this shift: No intake/output data recorded.  Labs: No results for input(s): HGB in the last 72 hours. No results for input(s): WBC, RBC, HCT, PLT in the last 72 hours. No results for input(s): NA, K, CL, CO2, BUN, CREATININE, GLUCOSE, CALCIUM in the last 72 hours. No results for input(s): LABPT, INR in the last 72 hours.  EXAM General - Patient is Alert, Appropriate, and Oriented Extremity - Neurologically intact Neurovascular intact Sensation intact distally Intact pulses distally Dorsiflexion/Plantar flexion intact No cellulitis present Compartment soft Dressing - dressing C/D/I and no drainage Motor Function - intact, moving foot and toes well on exam.   Past Medical History:  Diagnosis Date   Back pain, chronic    DDD (degenerative disc disease), lumbar    Diabetes mellitus without complication (HCC)    Genital herpes    GERD (gastroesophageal reflux disease)    Hypertension    Insomnia     Assessment/Plan: 2 Days  Post-Op Procedure(s) (LRB): ARTHROPLASTY, HIP, TOTAL,POSTERIOR APPROACH (Left) Principal Problem:   Hx of total hip arthroplasty, left  Estimated body mass index is 37.61 kg/m as calculated from the following:   Height as of this encounter: 5' 6 (1.676 m).   Weight as of this encounter: 105.7 kg. Advance diet Up with therapy   Patient had improved dizziness symptoms and energy has seemed to improve, Bps stable  Patient will continue to work with physical therapy to pass postoperative PT protocols, ROM and strengthening.   Hip Preacutions  Discussed with the patient continuing to utilize ice over the bandage  Patient will wear TED hose bilaterally to help prevent DVT and clot formation  Discussed the Aquacel bandage.  This bandage will stay in place 7 days postoperatively.  Can be replaced with honeycomb bandages that will be sent home with the patient  Discussed sending the patient home with tramadol  and oxycodone  for as needed pain management.  Patient will also be sent home with Celebrex  to help with swelling and inflammation.  Patient will take an 81 mg aspirin  twice daily for DVT prophylaxis  JP drain removed without difficulty, intact  Weight-Bearing as tolerated to left leg  Will plan to hold for one night with monitoring of symptoms and blood pressures  Patient will follow-up with [facility] clinic orthopedics in 6 weeks for re-imaging and reevaluation   Fonda Koyanagi, PA-C [facility] 04/26/2024, 7:29 AM

## 2024-04-26 NOTE — Plan of Care (Signed)
  Problem: Activity: Goal: Ability to tolerate increased activity will improve Outcome: Progressing   Problem: Pain Management: Goal: Pain level will decrease with appropriate interventions Outcome: Progressing   

## 2024-04-26 NOTE — Progress Notes (Signed)
 DISCHARGE NOTE:  Pt given discharge instructions and verbalized understanding. Pt wheeled to car by staff, brother providing transportation home.

## 2024-04-26 NOTE — Progress Notes (Signed)
 Physical Therapy Treatment Patient Details Name: Janet Walters MRN: [id number] DOB: 02/23/1961 Today's Date: 04/26/2024   History of Present Illness Pt is a 63 y.o. female s/p L THA secondary to degenerative arthrosis L hip 04/24/24.  PMH includes htn, DM, chronic back pain, insomnia, R THA (anterior approach).    PT Comments  Pt pleasant and motivated and ultimately showed ability to perform most tasks w/o need for excessive cuing.  She was initially very slow with ambulation, but with encouatgementa nd cuing she was able to increase quality of cadence, speed and confidence.  Pt doing well, will benefit from continued PT per THA protocol.     If plan is discharge home, recommend the following: A little help with walking and/or transfers;A little help with bathing/dressing/bathroom;Assistance with cooking/housework;Assist for transportation;Help with stairs or ramp for entrance   Can travel by private vehicle        Equipment Recommendations  None recommended by PT    Recommendations for Other Services       Precautions / Restrictions Precautions Precautions: Posterior Hip;Fall Precaution Booklet Issued: Yes (comment) Recall of Precautions/Restrictions: Intact Restrictions LLE Weight Bearing Per Provider Order: Weight bearing as tolerated     Mobility  Bed Mobility               General bed mobility comments: in recliner pre/post session    Transfers Overall transfer level: Needs assistance Equipment used: Rolling walker (2 wheels) Transfers: Sit to/from Stand Sit to Stand: Contact guard assist, Supervision           General transfer comment: Minimal cuing for U&LE set up, rises w/o assist    Ambulation/Gait Ambulation/Gait assistance: Contact guard assist, Supervision Gait Distance (Feet): 200 Feet Assistive device: Rolling walker (2 wheels)         General Gait Details: initially slow and hesitant, did increase cadence, speed and confidnce with ongoing  cues and encouragement   Stairs Stairs: Yes Stairs assistance: Supervision Stair Management: Two rails Number of Stairs: 4 General stair comments: Pt recalled appropriate stair negotiation strategy w/o cuing and sucessfully  managed steps w/o issue   Wheelchair Mobility     Tilt Bed    Modified Rankin (Stroke Patients Only)       Balance Overall balance assessment: Modified Independent   Sitting balance-Leahy Scale: Good       Standing balance-Leahy Scale: Good                              Communication    Cognition Arousal: Alert Behavior During Therapy: WFL for tasks assessed/performed   PT - Cognitive impairments: No apparent impairments                         Following commands: Intact      Cueing Cueing Techniques: Verbal cues  Exercises Total Joint Exercises Ankle Circles/Pumps: AROM, Strengthening, Both, 10 reps, Supine Quad Sets: AROM, Strengthening, Left, 10 reps, Supine Gluteal Sets: AROM, Strengthening, Both, 10 reps, Supine Hip ABduction/ADduction: AAROM, Strengthening, Left, 10 reps, Supine Long Arc Quad: Strengthening, 10 reps Marching in Standing: Seated, 10 reps, AROM, Strengthening    General Comments General comments (skin integrity, edema, etc.): Pt had good recall of precuations and stair strategy, did not need physical assist with transfers, no rest break needed with prolonged ambulation      Pertinent Vitals/Pain Pain Assessment Pain Assessment: Faces Faces Pain Scale: Hurts  a little bit Pain Location: very minimal hip pain, endorses some abdominal/belt discomfort    Home Living                          Prior Function            PT Goals (current goals can now be found in the care plan section) Progress towards PT goals: Progressing toward goals    Frequency    BID      PT Plan      Co-evaluation              AM-PAC PT 6 Clicks Mobility   Outcome Measure  Help needed  turning from your back to your side while in a flat bed without using bedrails?: None Help needed moving from lying on your back to sitting on the side of a flat bed without using bedrails?: None Help needed moving to and from a bed to a chair (including a wheelchair)?: None Help needed standing up from a chair using your arms (e.g., wheelchair or bedside chair)?: None Help needed to walk in hospital room?: None Help needed climbing 3-5 steps with a railing? : None 6 Click Score: 24    End of Session Equipment Utilized During Treatment: Gait belt Activity Tolerance: Patient limited by pain;Patient tolerated treatment well Patient left: in chair;with call bell/phone within reach Nurse Communication: Mobility status;Precautions;Weight bearing status;Other (comment) PT Visit Diagnosis: Other abnormalities of gait and mobility (R26.89);Muscle weakness (generalized) (M62.81);Pain Pain - Right/Left: Left Pain - part of body: Hip;Leg     Time: 9169-9144 PT Time Calculation (min) (ACUTE ONLY): 25 min  Charges:    $Gait Training: 8-22 mins $Therapeutic Exercise: 8-22 mins PT General Charges $$ ACUTE PT VISIT: 1 Visit                     Carmin JONELLE Deed, DPT 04/26/2024, 11:08 AM

## 2024-04-26 NOTE — Plan of Care (Signed)
 Progressing towards discharge

## 2024-04-27 DIAGNOSIS — Z471 Aftercare following joint replacement surgery: Secondary | ICD-10-CM | POA: Diagnosis not present

## 2024-04-27 DIAGNOSIS — Z791 Long term (current) use of non-steroidal anti-inflammatories (NSAID): Secondary | ICD-10-CM | POA: Diagnosis not present

## 2024-04-27 DIAGNOSIS — K219 Gastro-esophageal reflux disease without esophagitis: Secondary | ICD-10-CM | POA: Diagnosis not present

## 2024-04-27 DIAGNOSIS — Z7952 Long term (current) use of systemic steroids: Secondary | ICD-10-CM | POA: Diagnosis not present

## 2024-04-27 DIAGNOSIS — M5136 Other intervertebral disc degeneration, lumbar region with discogenic back pain only: Secondary | ICD-10-CM | POA: Diagnosis not present

## 2024-04-27 DIAGNOSIS — Z7982 Long term (current) use of aspirin: Secondary | ICD-10-CM | POA: Diagnosis not present

## 2024-04-27 DIAGNOSIS — Z96643 Presence of artificial hip joint, bilateral: Secondary | ICD-10-CM | POA: Diagnosis not present

## 2024-04-27 DIAGNOSIS — I10 Essential (primary) hypertension: Secondary | ICD-10-CM | POA: Diagnosis not present

## 2024-04-27 DIAGNOSIS — Z7984 Long term (current) use of oral hypoglycemic drugs: Secondary | ICD-10-CM | POA: Diagnosis not present

## 2024-04-27 DIAGNOSIS — E119 Type 2 diabetes mellitus without complications: Secondary | ICD-10-CM | POA: Diagnosis not present

## 2024-04-27 DIAGNOSIS — Z602 Problems related to living alone: Secondary | ICD-10-CM | POA: Diagnosis not present

## 2024-04-27 DIAGNOSIS — G4700 Insomnia, unspecified: Secondary | ICD-10-CM | POA: Diagnosis not present

## 2024-04-29 DIAGNOSIS — Z7982 Long term (current) use of aspirin: Secondary | ICD-10-CM | POA: Diagnosis not present

## 2024-04-29 DIAGNOSIS — Z96643 Presence of artificial hip joint, bilateral: Secondary | ICD-10-CM | POA: Diagnosis not present

## 2024-04-29 DIAGNOSIS — Z791 Long term (current) use of non-steroidal anti-inflammatories (NSAID): Secondary | ICD-10-CM | POA: Diagnosis not present

## 2024-04-29 DIAGNOSIS — K219 Gastro-esophageal reflux disease without esophagitis: Secondary | ICD-10-CM | POA: Diagnosis not present

## 2024-04-29 DIAGNOSIS — G4700 Insomnia, unspecified: Secondary | ICD-10-CM | POA: Diagnosis not present

## 2024-04-29 DIAGNOSIS — Z7952 Long term (current) use of systemic steroids: Secondary | ICD-10-CM | POA: Diagnosis not present

## 2024-04-29 DIAGNOSIS — Z471 Aftercare following joint replacement surgery: Secondary | ICD-10-CM | POA: Diagnosis not present

## 2024-04-29 DIAGNOSIS — M5136 Other intervertebral disc degeneration, lumbar region with discogenic back pain only: Secondary | ICD-10-CM | POA: Diagnosis not present

## 2024-04-29 DIAGNOSIS — I10 Essential (primary) hypertension: Secondary | ICD-10-CM | POA: Diagnosis not present

## 2024-04-29 DIAGNOSIS — Z7984 Long term (current) use of oral hypoglycemic drugs: Secondary | ICD-10-CM | POA: Diagnosis not present

## 2024-04-29 DIAGNOSIS — Z602 Problems related to living alone: Secondary | ICD-10-CM | POA: Diagnosis not present

## 2024-04-29 DIAGNOSIS — E119 Type 2 diabetes mellitus without complications: Secondary | ICD-10-CM | POA: Diagnosis not present

## 2024-05-01 DIAGNOSIS — Z7982 Long term (current) use of aspirin: Secondary | ICD-10-CM | POA: Diagnosis not present

## 2024-05-01 DIAGNOSIS — M5136 Other intervertebral disc degeneration, lumbar region with discogenic back pain only: Secondary | ICD-10-CM | POA: Diagnosis not present

## 2024-05-01 DIAGNOSIS — K219 Gastro-esophageal reflux disease without esophagitis: Secondary | ICD-10-CM | POA: Diagnosis not present

## 2024-05-01 DIAGNOSIS — E119 Type 2 diabetes mellitus without complications: Secondary | ICD-10-CM | POA: Diagnosis not present

## 2024-05-01 DIAGNOSIS — Z96643 Presence of artificial hip joint, bilateral: Secondary | ICD-10-CM | POA: Diagnosis not present

## 2024-05-01 DIAGNOSIS — Z602 Problems related to living alone: Secondary | ICD-10-CM | POA: Diagnosis not present

## 2024-05-01 DIAGNOSIS — Z791 Long term (current) use of non-steroidal anti-inflammatories (NSAID): Secondary | ICD-10-CM | POA: Diagnosis not present

## 2024-05-01 DIAGNOSIS — Z471 Aftercare following joint replacement surgery: Secondary | ICD-10-CM | POA: Diagnosis not present

## 2024-05-01 DIAGNOSIS — Z7952 Long term (current) use of systemic steroids: Secondary | ICD-10-CM | POA: Diagnosis not present

## 2024-05-01 DIAGNOSIS — Z7984 Long term (current) use of oral hypoglycemic drugs: Secondary | ICD-10-CM | POA: Diagnosis not present

## 2024-05-01 DIAGNOSIS — I10 Essential (primary) hypertension: Secondary | ICD-10-CM | POA: Diagnosis not present

## 2024-05-01 DIAGNOSIS — G4700 Insomnia, unspecified: Secondary | ICD-10-CM | POA: Diagnosis not present

## 2024-05-03 DIAGNOSIS — Z7982 Long term (current) use of aspirin: Secondary | ICD-10-CM | POA: Diagnosis not present

## 2024-05-03 DIAGNOSIS — I10 Essential (primary) hypertension: Secondary | ICD-10-CM | POA: Diagnosis not present

## 2024-05-03 DIAGNOSIS — Z7984 Long term (current) use of oral hypoglycemic drugs: Secondary | ICD-10-CM | POA: Diagnosis not present

## 2024-05-03 DIAGNOSIS — Z791 Long term (current) use of non-steroidal anti-inflammatories (NSAID): Secondary | ICD-10-CM | POA: Diagnosis not present

## 2024-05-03 DIAGNOSIS — K219 Gastro-esophageal reflux disease without esophagitis: Secondary | ICD-10-CM | POA: Diagnosis not present

## 2024-05-03 DIAGNOSIS — Z96643 Presence of artificial hip joint, bilateral: Secondary | ICD-10-CM | POA: Diagnosis not present

## 2024-05-03 DIAGNOSIS — E119 Type 2 diabetes mellitus without complications: Secondary | ICD-10-CM | POA: Diagnosis not present

## 2024-05-03 DIAGNOSIS — Z471 Aftercare following joint replacement surgery: Secondary | ICD-10-CM | POA: Diagnosis not present

## 2024-05-03 DIAGNOSIS — Z602 Problems related to living alone: Secondary | ICD-10-CM | POA: Diagnosis not present

## 2024-05-03 DIAGNOSIS — Z7952 Long term (current) use of systemic steroids: Secondary | ICD-10-CM | POA: Diagnosis not present

## 2024-05-03 DIAGNOSIS — M5136 Other intervertebral disc degeneration, lumbar region with discogenic back pain only: Secondary | ICD-10-CM | POA: Diagnosis not present

## 2024-05-03 DIAGNOSIS — G4700 Insomnia, unspecified: Secondary | ICD-10-CM | POA: Diagnosis not present

## 2024-05-05 DIAGNOSIS — K219 Gastro-esophageal reflux disease without esophagitis: Secondary | ICD-10-CM | POA: Diagnosis not present

## 2024-05-05 DIAGNOSIS — E119 Type 2 diabetes mellitus without complications: Secondary | ICD-10-CM | POA: Diagnosis not present

## 2024-05-05 DIAGNOSIS — Z7984 Long term (current) use of oral hypoglycemic drugs: Secondary | ICD-10-CM | POA: Diagnosis not present

## 2024-05-05 DIAGNOSIS — Z7982 Long term (current) use of aspirin: Secondary | ICD-10-CM | POA: Diagnosis not present

## 2024-05-05 DIAGNOSIS — G4700 Insomnia, unspecified: Secondary | ICD-10-CM | POA: Diagnosis not present

## 2024-05-05 DIAGNOSIS — Z7952 Long term (current) use of systemic steroids: Secondary | ICD-10-CM | POA: Diagnosis not present

## 2024-05-05 DIAGNOSIS — Z471 Aftercare following joint replacement surgery: Secondary | ICD-10-CM | POA: Diagnosis not present

## 2024-05-05 DIAGNOSIS — M5136 Other intervertebral disc degeneration, lumbar region with discogenic back pain only: Secondary | ICD-10-CM | POA: Diagnosis not present

## 2024-05-05 DIAGNOSIS — Z791 Long term (current) use of non-steroidal anti-inflammatories (NSAID): Secondary | ICD-10-CM | POA: Diagnosis not present

## 2024-05-05 DIAGNOSIS — I10 Essential (primary) hypertension: Secondary | ICD-10-CM | POA: Diagnosis not present

## 2024-05-05 DIAGNOSIS — Z602 Problems related to living alone: Secondary | ICD-10-CM | POA: Diagnosis not present

## 2024-05-05 DIAGNOSIS — Z96643 Presence of artificial hip joint, bilateral: Secondary | ICD-10-CM | POA: Diagnosis not present

## 2024-05-08 DIAGNOSIS — Z7952 Long term (current) use of systemic steroids: Secondary | ICD-10-CM | POA: Diagnosis not present

## 2024-05-08 DIAGNOSIS — E119 Type 2 diabetes mellitus without complications: Secondary | ICD-10-CM | POA: Diagnosis not present

## 2024-05-08 DIAGNOSIS — I10 Essential (primary) hypertension: Secondary | ICD-10-CM | POA: Diagnosis not present

## 2024-05-08 DIAGNOSIS — Z7984 Long term (current) use of oral hypoglycemic drugs: Secondary | ICD-10-CM | POA: Diagnosis not present

## 2024-05-08 DIAGNOSIS — Z7982 Long term (current) use of aspirin: Secondary | ICD-10-CM | POA: Diagnosis not present

## 2024-05-08 DIAGNOSIS — Z471 Aftercare following joint replacement surgery: Secondary | ICD-10-CM | POA: Diagnosis not present

## 2024-05-08 DIAGNOSIS — Z602 Problems related to living alone: Secondary | ICD-10-CM | POA: Diagnosis not present

## 2024-05-08 DIAGNOSIS — G4700 Insomnia, unspecified: Secondary | ICD-10-CM | POA: Diagnosis not present

## 2024-05-08 DIAGNOSIS — Z96643 Presence of artificial hip joint, bilateral: Secondary | ICD-10-CM | POA: Diagnosis not present

## 2024-05-08 DIAGNOSIS — K219 Gastro-esophageal reflux disease without esophagitis: Secondary | ICD-10-CM | POA: Diagnosis not present

## 2024-05-08 DIAGNOSIS — Z791 Long term (current) use of non-steroidal anti-inflammatories (NSAID): Secondary | ICD-10-CM | POA: Diagnosis not present

## 2024-05-08 DIAGNOSIS — M5136 Other intervertebral disc degeneration, lumbar region with discogenic back pain only: Secondary | ICD-10-CM | POA: Diagnosis not present

## 2024-05-10 DIAGNOSIS — I10 Essential (primary) hypertension: Secondary | ICD-10-CM | POA: Diagnosis not present

## 2024-05-10 DIAGNOSIS — G4700 Insomnia, unspecified: Secondary | ICD-10-CM | POA: Diagnosis not present

## 2024-05-10 DIAGNOSIS — Z471 Aftercare following joint replacement surgery: Secondary | ICD-10-CM | POA: Diagnosis not present

## 2024-05-10 DIAGNOSIS — M5136 Other intervertebral disc degeneration, lumbar region with discogenic back pain only: Secondary | ICD-10-CM | POA: Diagnosis not present

## 2024-05-10 DIAGNOSIS — Z7984 Long term (current) use of oral hypoglycemic drugs: Secondary | ICD-10-CM | POA: Diagnosis not present

## 2024-05-10 DIAGNOSIS — Z7982 Long term (current) use of aspirin: Secondary | ICD-10-CM | POA: Diagnosis not present

## 2024-05-10 DIAGNOSIS — Z791 Long term (current) use of non-steroidal anti-inflammatories (NSAID): Secondary | ICD-10-CM | POA: Diagnosis not present

## 2024-05-10 DIAGNOSIS — K219 Gastro-esophageal reflux disease without esophagitis: Secondary | ICD-10-CM | POA: Diagnosis not present

## 2024-05-10 DIAGNOSIS — Z602 Problems related to living alone: Secondary | ICD-10-CM | POA: Diagnosis not present

## 2024-05-10 DIAGNOSIS — Z7952 Long term (current) use of systemic steroids: Secondary | ICD-10-CM | POA: Diagnosis not present

## 2024-05-10 DIAGNOSIS — Z96643 Presence of artificial hip joint, bilateral: Secondary | ICD-10-CM | POA: Diagnosis not present

## 2024-05-10 DIAGNOSIS — E119 Type 2 diabetes mellitus without complications: Secondary | ICD-10-CM | POA: Diagnosis not present

## 2024-05-12 DIAGNOSIS — K219 Gastro-esophageal reflux disease without esophagitis: Secondary | ICD-10-CM | POA: Diagnosis not present

## 2024-05-12 DIAGNOSIS — Z7982 Long term (current) use of aspirin: Secondary | ICD-10-CM | POA: Diagnosis not present

## 2024-05-12 DIAGNOSIS — Z7984 Long term (current) use of oral hypoglycemic drugs: Secondary | ICD-10-CM | POA: Diagnosis not present

## 2024-05-12 DIAGNOSIS — Z7952 Long term (current) use of systemic steroids: Secondary | ICD-10-CM | POA: Diagnosis not present

## 2024-05-12 DIAGNOSIS — M5136 Other intervertebral disc degeneration, lumbar region with discogenic back pain only: Secondary | ICD-10-CM | POA: Diagnosis not present

## 2024-05-12 DIAGNOSIS — I10 Essential (primary) hypertension: Secondary | ICD-10-CM | POA: Diagnosis not present

## 2024-05-12 DIAGNOSIS — G4700 Insomnia, unspecified: Secondary | ICD-10-CM | POA: Diagnosis not present

## 2024-05-12 DIAGNOSIS — Z96643 Presence of artificial hip joint, bilateral: Secondary | ICD-10-CM | POA: Diagnosis not present

## 2024-05-12 DIAGNOSIS — E119 Type 2 diabetes mellitus without complications: Secondary | ICD-10-CM | POA: Diagnosis not present

## 2024-05-12 DIAGNOSIS — Z471 Aftercare following joint replacement surgery: Secondary | ICD-10-CM | POA: Diagnosis not present

## 2024-05-12 DIAGNOSIS — Z791 Long term (current) use of non-steroidal anti-inflammatories (NSAID): Secondary | ICD-10-CM | POA: Diagnosis not present

## 2024-05-12 DIAGNOSIS — Z602 Problems related to living alone: Secondary | ICD-10-CM | POA: Diagnosis not present

## 2024-05-15 DIAGNOSIS — M5136 Other intervertebral disc degeneration, lumbar region with discogenic back pain only: Secondary | ICD-10-CM | POA: Diagnosis not present

## 2024-05-15 DIAGNOSIS — Z7984 Long term (current) use of oral hypoglycemic drugs: Secondary | ICD-10-CM | POA: Diagnosis not present

## 2024-05-15 DIAGNOSIS — Z96643 Presence of artificial hip joint, bilateral: Secondary | ICD-10-CM | POA: Diagnosis not present

## 2024-05-15 DIAGNOSIS — K219 Gastro-esophageal reflux disease without esophagitis: Secondary | ICD-10-CM | POA: Diagnosis not present

## 2024-05-15 DIAGNOSIS — Z7982 Long term (current) use of aspirin: Secondary | ICD-10-CM | POA: Diagnosis not present

## 2024-05-15 DIAGNOSIS — Z7952 Long term (current) use of systemic steroids: Secondary | ICD-10-CM | POA: Diagnosis not present

## 2024-05-15 DIAGNOSIS — I10 Essential (primary) hypertension: Secondary | ICD-10-CM | POA: Diagnosis not present

## 2024-05-15 DIAGNOSIS — E119 Type 2 diabetes mellitus without complications: Secondary | ICD-10-CM | POA: Diagnosis not present

## 2024-05-15 DIAGNOSIS — Z791 Long term (current) use of non-steroidal anti-inflammatories (NSAID): Secondary | ICD-10-CM | POA: Diagnosis not present

## 2024-05-15 DIAGNOSIS — Z471 Aftercare following joint replacement surgery: Secondary | ICD-10-CM | POA: Diagnosis not present

## 2024-05-15 DIAGNOSIS — Z602 Problems related to living alone: Secondary | ICD-10-CM | POA: Diagnosis not present

## 2024-05-15 DIAGNOSIS — G4700 Insomnia, unspecified: Secondary | ICD-10-CM | POA: Diagnosis not present

## 2024-05-17 DIAGNOSIS — Z471 Aftercare following joint replacement surgery: Secondary | ICD-10-CM | POA: Diagnosis not present

## 2024-05-17 DIAGNOSIS — Z7984 Long term (current) use of oral hypoglycemic drugs: Secondary | ICD-10-CM | POA: Diagnosis not present

## 2024-05-17 DIAGNOSIS — I10 Essential (primary) hypertension: Secondary | ICD-10-CM | POA: Diagnosis not present

## 2024-05-17 DIAGNOSIS — G4700 Insomnia, unspecified: Secondary | ICD-10-CM | POA: Diagnosis not present

## 2024-05-17 DIAGNOSIS — Z7982 Long term (current) use of aspirin: Secondary | ICD-10-CM | POA: Diagnosis not present

## 2024-05-17 DIAGNOSIS — E119 Type 2 diabetes mellitus without complications: Secondary | ICD-10-CM | POA: Diagnosis not present

## 2024-05-17 DIAGNOSIS — Z602 Problems related to living alone: Secondary | ICD-10-CM | POA: Diagnosis not present

## 2024-05-17 DIAGNOSIS — Z96643 Presence of artificial hip joint, bilateral: Secondary | ICD-10-CM | POA: Diagnosis not present

## 2024-05-17 DIAGNOSIS — Z791 Long term (current) use of non-steroidal anti-inflammatories (NSAID): Secondary | ICD-10-CM | POA: Diagnosis not present

## 2024-05-17 DIAGNOSIS — K219 Gastro-esophageal reflux disease without esophagitis: Secondary | ICD-10-CM | POA: Diagnosis not present

## 2024-05-17 DIAGNOSIS — Z7952 Long term (current) use of systemic steroids: Secondary | ICD-10-CM | POA: Diagnosis not present

## 2024-05-17 DIAGNOSIS — M5136 Other intervertebral disc degeneration, lumbar region with discogenic back pain only: Secondary | ICD-10-CM | POA: Diagnosis not present

## 2024-05-23 DIAGNOSIS — Z7952 Long term (current) use of systemic steroids: Secondary | ICD-10-CM | POA: Diagnosis not present

## 2024-05-23 DIAGNOSIS — Z471 Aftercare following joint replacement surgery: Secondary | ICD-10-CM | POA: Diagnosis not present

## 2024-05-23 DIAGNOSIS — Z791 Long term (current) use of non-steroidal anti-inflammatories (NSAID): Secondary | ICD-10-CM | POA: Diagnosis not present

## 2024-05-23 DIAGNOSIS — Z7984 Long term (current) use of oral hypoglycemic drugs: Secondary | ICD-10-CM | POA: Diagnosis not present

## 2024-05-23 DIAGNOSIS — Z602 Problems related to living alone: Secondary | ICD-10-CM | POA: Diagnosis not present

## 2024-05-23 DIAGNOSIS — Z96643 Presence of artificial hip joint, bilateral: Secondary | ICD-10-CM | POA: Diagnosis not present

## 2024-05-23 DIAGNOSIS — G4700 Insomnia, unspecified: Secondary | ICD-10-CM | POA: Diagnosis not present

## 2024-05-23 DIAGNOSIS — M5136 Other intervertebral disc degeneration, lumbar region with discogenic back pain only: Secondary | ICD-10-CM | POA: Diagnosis not present

## 2024-05-23 DIAGNOSIS — I10 Essential (primary) hypertension: Secondary | ICD-10-CM | POA: Diagnosis not present

## 2024-05-23 DIAGNOSIS — Z7982 Long term (current) use of aspirin: Secondary | ICD-10-CM | POA: Diagnosis not present

## 2024-05-23 DIAGNOSIS — K219 Gastro-esophageal reflux disease without esophagitis: Secondary | ICD-10-CM | POA: Diagnosis not present

## 2024-05-23 DIAGNOSIS — E119 Type 2 diabetes mellitus without complications: Secondary | ICD-10-CM | POA: Diagnosis not present

## 2024-05-26 ENCOUNTER — Other Ambulatory Visit: Payer: Self-pay | Admitting: Physician Assistant

## 2024-05-26 ENCOUNTER — Encounter: Payer: Self-pay | Admitting: Physician Assistant

## 2024-05-26 DIAGNOSIS — M5136 Other intervertebral disc degeneration, lumbar region with discogenic back pain only: Secondary | ICD-10-CM | POA: Diagnosis not present

## 2024-05-26 DIAGNOSIS — Z7952 Long term (current) use of systemic steroids: Secondary | ICD-10-CM | POA: Diagnosis not present

## 2024-05-26 DIAGNOSIS — Z1231 Encounter for screening mammogram for malignant neoplasm of breast: Secondary | ICD-10-CM

## 2024-05-26 DIAGNOSIS — G4700 Insomnia, unspecified: Secondary | ICD-10-CM | POA: Diagnosis not present

## 2024-05-26 DIAGNOSIS — E119 Type 2 diabetes mellitus without complications: Secondary | ICD-10-CM | POA: Diagnosis not present

## 2024-05-26 DIAGNOSIS — Z7982 Long term (current) use of aspirin: Secondary | ICD-10-CM | POA: Diagnosis not present

## 2024-05-26 DIAGNOSIS — I10 Essential (primary) hypertension: Secondary | ICD-10-CM | POA: Diagnosis not present

## 2024-05-26 DIAGNOSIS — Z602 Problems related to living alone: Secondary | ICD-10-CM | POA: Diagnosis not present

## 2024-05-26 DIAGNOSIS — Z471 Aftercare following joint replacement surgery: Secondary | ICD-10-CM | POA: Diagnosis not present

## 2024-05-26 DIAGNOSIS — Z96643 Presence of artificial hip joint, bilateral: Secondary | ICD-10-CM | POA: Diagnosis not present

## 2024-05-26 DIAGNOSIS — Z791 Long term (current) use of non-steroidal anti-inflammatories (NSAID): Secondary | ICD-10-CM | POA: Diagnosis not present

## 2024-05-26 DIAGNOSIS — Z7984 Long term (current) use of oral hypoglycemic drugs: Secondary | ICD-10-CM | POA: Diagnosis not present

## 2024-05-26 DIAGNOSIS — K219 Gastro-esophageal reflux disease without esophagitis: Secondary | ICD-10-CM | POA: Diagnosis not present

## 2024-05-30 DIAGNOSIS — E119 Type 2 diabetes mellitus without complications: Secondary | ICD-10-CM | POA: Diagnosis not present

## 2024-05-30 DIAGNOSIS — I10 Essential (primary) hypertension: Secondary | ICD-10-CM | POA: Diagnosis not present

## 2024-05-30 DIAGNOSIS — Z7952 Long term (current) use of systemic steroids: Secondary | ICD-10-CM | POA: Diagnosis not present

## 2024-05-30 DIAGNOSIS — K219 Gastro-esophageal reflux disease without esophagitis: Secondary | ICD-10-CM | POA: Diagnosis not present

## 2024-05-30 DIAGNOSIS — G4700 Insomnia, unspecified: Secondary | ICD-10-CM | POA: Diagnosis not present

## 2024-05-30 DIAGNOSIS — Z471 Aftercare following joint replacement surgery: Secondary | ICD-10-CM | POA: Diagnosis not present

## 2024-05-30 DIAGNOSIS — Z791 Long term (current) use of non-steroidal anti-inflammatories (NSAID): Secondary | ICD-10-CM | POA: Diagnosis not present

## 2024-05-30 DIAGNOSIS — Z602 Problems related to living alone: Secondary | ICD-10-CM | POA: Diagnosis not present

## 2024-05-30 DIAGNOSIS — M5136 Other intervertebral disc degeneration, lumbar region with discogenic back pain only: Secondary | ICD-10-CM | POA: Diagnosis not present

## 2024-05-30 DIAGNOSIS — Z96643 Presence of artificial hip joint, bilateral: Secondary | ICD-10-CM | POA: Diagnosis not present

## 2024-05-30 DIAGNOSIS — Z7982 Long term (current) use of aspirin: Secondary | ICD-10-CM | POA: Diagnosis not present

## 2024-05-30 DIAGNOSIS — Z7984 Long term (current) use of oral hypoglycemic drugs: Secondary | ICD-10-CM | POA: Diagnosis not present

## 2024-06-05 DIAGNOSIS — Z791 Long term (current) use of non-steroidal anti-inflammatories (NSAID): Secondary | ICD-10-CM | POA: Diagnosis not present

## 2024-06-05 DIAGNOSIS — Z96643 Presence of artificial hip joint, bilateral: Secondary | ICD-10-CM | POA: Diagnosis not present

## 2024-06-05 DIAGNOSIS — Z602 Problems related to living alone: Secondary | ICD-10-CM | POA: Diagnosis not present

## 2024-06-05 DIAGNOSIS — G4700 Insomnia, unspecified: Secondary | ICD-10-CM | POA: Diagnosis not present

## 2024-06-05 DIAGNOSIS — M5136 Other intervertebral disc degeneration, lumbar region with discogenic back pain only: Secondary | ICD-10-CM | POA: Diagnosis not present

## 2024-06-05 DIAGNOSIS — Z7952 Long term (current) use of systemic steroids: Secondary | ICD-10-CM | POA: Diagnosis not present

## 2024-06-05 DIAGNOSIS — Z7982 Long term (current) use of aspirin: Secondary | ICD-10-CM | POA: Diagnosis not present

## 2024-06-05 DIAGNOSIS — I10 Essential (primary) hypertension: Secondary | ICD-10-CM | POA: Diagnosis not present

## 2024-06-05 DIAGNOSIS — E119 Type 2 diabetes mellitus without complications: Secondary | ICD-10-CM | POA: Diagnosis not present

## 2024-06-05 DIAGNOSIS — Z471 Aftercare following joint replacement surgery: Secondary | ICD-10-CM | POA: Diagnosis not present

## 2024-06-05 DIAGNOSIS — K219 Gastro-esophageal reflux disease without esophagitis: Secondary | ICD-10-CM | POA: Diagnosis not present

## 2024-06-05 DIAGNOSIS — Z7984 Long term (current) use of oral hypoglycemic drugs: Secondary | ICD-10-CM | POA: Diagnosis not present

## 2024-06-06 DIAGNOSIS — Z96642 Presence of left artificial hip joint: Secondary | ICD-10-CM | POA: Diagnosis not present

## 2024-06-08 ENCOUNTER — Ambulatory Visit: Payer: Self-pay

## 2024-06-08 ENCOUNTER — Ambulatory Visit (INDEPENDENT_AMBULATORY_CARE_PROVIDER_SITE_OTHER): Admitting: Physician Assistant

## 2024-06-08 VITALS — BP 129/69 | HR 79 | Temp 98.2°F | Ht 66.0 in | Wt 242.5 lb

## 2024-06-08 DIAGNOSIS — M25552 Pain in left hip: Secondary | ICD-10-CM

## 2024-06-08 DIAGNOSIS — M25511 Pain in right shoulder: Secondary | ICD-10-CM

## 2024-06-08 DIAGNOSIS — G8929 Other chronic pain: Secondary | ICD-10-CM

## 2024-06-08 DIAGNOSIS — Z96641 Presence of right artificial hip joint: Secondary | ICD-10-CM | POA: Diagnosis not present

## 2024-06-08 NOTE — Telephone Encounter (Signed)
 FYI Only or Action Required?: FYI only for provider.  Patient was last seen in primary care on .  Called Nurse Triage reporting Shoulder Pain. Maybe from PT.   Symptoms began several weeks ago.  Interventions attempted: OTC medications: IBU - no relief.  Symptoms are: unchanged.  Triage Disposition: See PCP When Office is Open (Within 3 Days)  Patient/caregiver understands and will follow disposition?: Yes                    Copied from CRM #[id number]. Topic: Clinical - Red Word Triage >> Jun 08, 2024  8:18 AM Janet Walters wrote: Red Word that prompted transfer to Nurse Triage: Patient has been experiencing left shoulder pain for the last two weeks. Reason for Disposition  [1] MODERATE pain (e.g., interferes with normal activities) AND [2] present > 3 days  Answer Assessment - Initial Assessment Questions 1. ONSET: When did the pain start?     2 weeks 2. LOCATION: Where is the pain located?     Left shoulder - worse when laying down 3. PAIN: How bad is the pain? (Scale 1-10; or mild, moderate, severe)     5/10 now, when laying down up to 10/10 4. WORK OR EXERCISE: Has there been any recent work or exercise that involved this part of the body?    Pt has had PT for hip and was pushing up with left arm. 5. CAUSE: What do you think is causing the shoulder pain?     no 6. OTHER SYMPTOMS: Do you have any other symptoms? (e.g., neck pain, swelling, rash, fever, numbness, weakness)     Neck pain that has resolved  Protocols used: Shoulder Pain-A-AH

## 2024-06-08 NOTE — Progress Notes (Signed)
 Established patient visit  Patient: Janet Walters   DOB: 01-29-1961   63 y.o. Female  MRN: [id number] Visit Date: 06/08/2024  Today's healthcare provider: Jolynn Spencer, PA-C   Chief Complaint  Patient presents with   Shoulder Pain    Patient reports left shoulder pain ongoing for 2 weeks. Does not recall an injury. States she recently had hip replacement and had physical therapy, wonders if one of the exercises could have put strain on her shoulder.    Subjective     HPI     Shoulder Pain    Additional comments: Patient reports left shoulder pain ongoing for 2 weeks. Does not recall an injury. States she recently had hip replacement and had physical therapy, wonders if one of the exercises could have put strain on her shoulder.       Last edited by Cherry Chiquita HERO, CMA on 06/08/2024  3:52 PM.       Discussed the use of AI scribe software for clinical note transcription with the patient, who gave verbal consent to proceed.  History of Present Illness Janet Walters is a 63 year old female who presents with shoulder pain following physical therapy.  She experiences localized shoulder pain, particularly during certain movements, which worsens at night, especially when sleeping on the affected side. To alleviate discomfort, she avoids sleeping on that side and uses pillows for support.  She completed a course of physical therapy, which included exercises like sitting down and standing up from a chair. She believes these activities may have contributed to her current pain.  Her current medications include Celebrex , taken nightly, and gabapentin , taken as one dose at bedtime. She also uses Voltaren and other over-the-counter treatments to manage her symptoms.       06/08/2024    3:53 PM 11/25/2023    3:55 PM 03/18/2023    3:00 PM  Depression screen PHQ 2/9  Decreased Interest 0 2 0  Down, Depressed, Hopeless 0 0 0  PHQ - 2 Score 0 2 0  Altered sleeping 3 1 2   Tired, decreased energy  0 0 0  Change in appetite 0 0 0  Feeling bad or failure about yourself  0 0 0  Trouble concentrating 0 0 0  Moving slowly or fidgety/restless 0 1 0  Suicidal thoughts 0 0 0  PHQ-9 Score 3 4 2   Difficult doing work/chores Not difficult at all Very difficult Not difficult at all      06/08/2024    3:54 PM 11/25/2023    3:55 PM  GAD 7 : Generalized Anxiety Score  Nervous, Anxious, on Edge 0 0  Control/stop worrying 0 0  Worry too much - different things 1 1  Trouble relaxing 1 0  Restless 0 0  Easily annoyed or irritable 0 1  Afraid - awful might happen 0 0  Total GAD 7 Score 2 2  Anxiety Difficulty Not difficult at all Not difficult at all    Medications: Outpatient Medications Prior to Visit  Medication Sig   acetaminophen  (TYLENOL ) 650 MG CR tablet Take 1,300 mg by mouth every 8 (eight) hours as needed for pain.   aspirin  81 MG chewable tablet Chew 1 tablet (81 mg total) by mouth 2 (two) times daily.   celecoxib  (CELEBREX ) 200 MG capsule Take 1 capsule (200 mg total) by mouth 2 (two) times daily.   famotidine  (PEPCID ) 20 MG tablet Take 20 mg by mouth 2 (two) times daily as needed for heartburn or  indigestion.   gabapentin  (NEURONTIN ) 300 MG capsule TAKE 1 CAPSULE BY MOUTH DAILY AT BEDTIME   Homeopathic Products (LEG CRAMPS) TABS Take 1-2 tablets by mouth daily as needed (cramps).   lisinopril -hydrochlorothiazide  (ZESTORETIC ) 20-25 MG tablet TAKE 1 TABLET BY MOUTH EVERY L[place]   methocarbamol  (ROBAXIN -750) 750 MG tablet Take 1 tablet (750 mg total) by mouth every 8 (eight) hours as needed for muscle spasms.   oxyCODONE  (OXY IR/ROXICODONE ) 5 MG immediate release tablet Take 1 tablet (5 mg total) by mouth every 4 (four) hours as needed for moderate pain (pain score 4-6) (pain score 4-6).   tiZANidine (ZANAFLEX) 4 MG tablet Take 4 mg by mouth every 6 (six) hours as needed for muscle spasms.   traMADol  (ULTRAM ) 50 MG tablet Take 1 tablet (50 mg total) by mouth every 6 (six) hours as  needed for moderate pain (pain score 4-6).   [DISCONTINUED] HYDROcodone -acetaminophen  (NORCO/VICODIN) 5-325 MG tablet Take 1 tablet by mouth every 4 (four) hours as needed for moderate pain (pain score 4-6). (Patient not taking: Reported on 04/18/2024)   [DISCONTINUED] predniSONE  (DELTASONE ) 20 MG tablet Take 1 tablet (20 mg total) by mouth 2 (two) times daily with a meal. (Patient not taking: Reported on 04/13/2024)   [DISCONTINUED] Semaglutide  (RYBELSUS ) 3 MG TABS Take 1 tablet (3 mg total) by mouth daily. (Patient not taking: Reported on 04/13/2024)   No facility-administered medications prior to visit.    Review of Systems All negative Except see HPI       Objective    BP 129/69 (BP Location: Right Arm, Patient Position: Sitting, Cuff Size: Normal)   Pulse 79   Temp 98.2 F (36.8 C) (Oral)   Ht 5' 6 (1.676 m)   Wt 242 lb 8 oz (110 kg)   SpO2 100%   BMI 39.14 kg/m     Physical Exam Vitals reviewed.  Constitutional:      General: She is not in acute distress.    Appearance: Normal appearance. She is well-developed. She is not diaphoretic.  HENT:     Head: Normocephalic and atraumatic.  Eyes:     General: No scleral icterus.    Conjunctiva/sclera: Conjunctivae normal.  Neck:     Thyroid: No thyromegaly.  Cardiovascular:     Rate and Rhythm: Normal rate and regular rhythm.     Pulses: Normal pulses.     Heart sounds: Normal heart sounds. No murmur heard. Pulmonary:     Effort: Pulmonary effort is normal. No respiratory distress.     Breath sounds: Normal breath sounds. No wheezing, rhonchi or rales.  Musculoskeletal:     Cervical back: Neck supple.     Right lower leg: No edema.     Left lower leg: No edema.  Lymphadenopathy:     Cervical: No cervical adenopathy.  Skin:    General: Skin is warm and dry.     Findings: No rash.  Neurological:     Mental Status: She is alert and oriented to person, place, and time. Mental status is at baseline.  Psychiatric:         Mood and Affect: Mood normal.        Behavior: Behavior normal.      No results found for any visits on 06/08/24.      Assessment & Plan Right shoulder and upper arm pain Chronic pain likely due to muscle strain or sprain, interfering with sleep. Completed physical therapy but pain persists. - Advise use of heat and OTC water-based  gel for pain relief. - Recommend rest and avoidance of exacerbating movements. - Suggest use of a brace for shoulder and upper arm support. - Instruct on proper sleeping position with pillow support. - Consider referral to physical therapy if no improvement in two weeks. - Consider orthopedic assessment if pain persists beyond two to three weeks.  History of total hip replacement, right Doing PT for recovery s/p hip replacement Will follow-up   No orders of the defined types were placed in this encounter.   No follow-ups on file.   The patient was advised to call back or seek an in-person evaluation if the symptoms worsen or if the condition fails to improve as anticipated.  I discussed the assessment and treatment plan with the patient. The patient was provided an opportunity to ask questions and all were answered. The patient agreed with the plan and demonstrated an understanding of the instructions.  I, Kyrah Schiro, PA-C have reviewed all documentation for this visit. The documentation on 06/08/2024  for the exam, diagnosis, procedures, and orders are all accurate and complete.  Jolynn Spencer, [facility], MMS [facility] [phone] (phone) [phone] (fax)  [facility]

## 2024-06-12 DIAGNOSIS — G8929 Other chronic pain: Secondary | ICD-10-CM | POA: Insufficient documentation

## 2024-06-26 ENCOUNTER — Encounter

## 2024-07-11 NOTE — Progress Notes (Signed)
 Janet Walters                                          MRN: [id number]   07/11/2024   The [facility] Quality Team Specialist reviewed this patient medical record for the purposes of chart review for care gap closure. The following were reviewed: chart review for care gap closure-kidney health evaluation for diabetes:eGFR  and uACR.    [facility] Quality Team

## 2024-07-14 ENCOUNTER — Ambulatory Visit
Admission: RE | Admit: 2024-07-14 | Discharge: 2024-07-14 | Disposition: A | Discharge status: Home / Self Care | Source: Ambulatory Visit | Attending: Physician Assistant | Admitting: Physician Assistant

## 2024-07-14 DIAGNOSIS — Z1231 Encounter for screening mammogram for malignant neoplasm of breast: Secondary | ICD-10-CM | POA: Diagnosis not present

## 2024-07-19 ENCOUNTER — Ambulatory Visit: Payer: Self-pay | Admitting: Physician Assistant

## 2024-07-22 ENCOUNTER — Other Ambulatory Visit: Payer: Self-pay | Admitting: Physician Assistant

## 2024-07-22 DIAGNOSIS — G8929 Other chronic pain: Secondary | ICD-10-CM

## 2024-08-01 DIAGNOSIS — H524 Presbyopia: Secondary | ICD-10-CM | POA: Diagnosis not present

## 2024-08-01 DIAGNOSIS — H5203 Hypermetropia, bilateral: Secondary | ICD-10-CM | POA: Diagnosis not present
